# Patient Record
Sex: Male | Born: 1965 | Race: Black or African American | Hispanic: No
Health system: Southern US, Community
[De-identification: ages and names within clinical notes are randomized; demographics above are authoritative.]

---

## 2005-12-06 ENCOUNTER — Emergency Department: Payer: Self-pay | Admitting: General Practice

## 2006-09-16 ENCOUNTER — Emergency Department: Payer: Self-pay | Admitting: Emergency Medicine

## 2010-05-21 ENCOUNTER — Emergency Department: Payer: Self-pay | Admitting: Internal Medicine

## 2010-08-15 ENCOUNTER — Ambulatory Visit: Payer: Self-pay | Admitting: Internal Medicine

## 2010-08-20 ENCOUNTER — Inpatient Hospital Stay: Payer: Self-pay | Admitting: Internal Medicine

## 2010-08-22 LAB — CEA: CEA: 693.8 ng/mL — ABNORMAL HIGH (ref 0.0–4.7)

## 2010-08-22 LAB — AFP TUMOR MARKER: AFP-Tumor Marker: 2.3 ng/mL (ref 0.0–8.3)

## 2010-08-22 LAB — PSA

## 2010-08-26 ENCOUNTER — Ambulatory Visit: Payer: Self-pay | Admitting: Vascular Surgery

## 2010-08-28 ENCOUNTER — Ambulatory Visit: Payer: Self-pay | Admitting: Internal Medicine

## 2010-09-15 ENCOUNTER — Ambulatory Visit: Payer: Self-pay | Admitting: Internal Medicine

## 2010-10-04 LAB — CANCER ANTIGEN 19-9

## 2010-10-15 ENCOUNTER — Ambulatory Visit: Payer: Self-pay | Admitting: Internal Medicine

## 2010-11-15 ENCOUNTER — Ambulatory Visit: Payer: Self-pay | Admitting: Internal Medicine

## 2010-12-12 LAB — CANCER ANTIGEN 19-9: CA 19-9: 34700 U/mL — ABNORMAL HIGH (ref 0–35)

## 2010-12-15 ENCOUNTER — Ambulatory Visit: Payer: Self-pay | Admitting: Internal Medicine

## 2011-01-15 ENCOUNTER — Ambulatory Visit: Payer: Self-pay | Admitting: Internal Medicine

## 2011-01-16 LAB — CANCER ANTIGEN 19-9: CA 19-9: 9168 U/mL — ABNORMAL HIGH (ref 0–35)

## 2011-02-15 ENCOUNTER — Ambulatory Visit: Payer: Self-pay | Admitting: Internal Medicine

## 2011-02-19 ENCOUNTER — Inpatient Hospital Stay: Payer: Self-pay | Admitting: Internal Medicine

## 2011-02-27 ENCOUNTER — Ambulatory Visit: Payer: Self-pay | Admitting: Internal Medicine

## 2011-03-17 ENCOUNTER — Ambulatory Visit: Payer: Self-pay | Admitting: Internal Medicine

## 2011-04-08 LAB — CANCER ANTIGEN 19-9: CA 19-9: 671 U/mL — ABNORMAL HIGH (ref 0–35)

## 2011-04-17 ENCOUNTER — Ambulatory Visit: Payer: Self-pay | Admitting: Internal Medicine

## 2011-04-29 LAB — CANCER ANTIGEN 19-9: CA 19-9: 329 U/mL — ABNORMAL HIGH (ref 0–35)

## 2011-05-17 ENCOUNTER — Ambulatory Visit: Payer: Self-pay | Admitting: Internal Medicine

## 2011-06-17 ENCOUNTER — Ambulatory Visit: Payer: Self-pay | Admitting: Internal Medicine

## 2011-06-18 LAB — BASIC METABOLIC PANEL
BUN: 16 mg/dL (ref 7–18)
Calcium, Total: 9.5 mg/dL (ref 8.5–10.1)
Chloride: 102 mmol/L (ref 98–107)
Co2: 29 mmol/L (ref 21–32)
Osmolality: 280 (ref 275–301)
Potassium: 4.4 mmol/L (ref 3.5–5.1)
Sodium: 139 mmol/L (ref 136–145)

## 2011-06-18 LAB — CBC CANCER CENTER
Basophil #: 0 x10 3/mm (ref 0.0–0.1)
Basophil %: 0.3 %
HCT: 41 % (ref 40.0–52.0)
HGB: 14.3 g/dL (ref 13.0–18.0)
Lymphocyte #: 3.9 x10 3/mm — ABNORMAL HIGH (ref 1.0–3.6)
MCH: 32.7 pg (ref 26.0–34.0)
MCV: 94 fL (ref 80–100)
Monocyte #: 1.4 x10 3/mm — ABNORMAL HIGH (ref 0.0–0.7)
Monocyte %: 11.6 %
Neutrophil #: 6.3 x10 3/mm (ref 1.4–6.5)
Platelet: 152 x10 3/mm (ref 150–440)
RBC: 4.37 10*6/uL — ABNORMAL LOW (ref 4.40–5.90)
RDW: 15.5 % — ABNORMAL HIGH (ref 11.5–14.5)
WBC: 11.7 x10 3/mm — ABNORMAL HIGH (ref 3.8–10.6)

## 2011-06-25 LAB — HEPATIC FUNCTION PANEL A (ARMC)
Albumin: 4.2 g/dL (ref 3.4–5.0)
Alkaline Phosphatase: 129 U/L (ref 50–136)
Bilirubin,Total: 0.3 mg/dL (ref 0.2–1.0)
SGOT(AST): 32 U/L (ref 15–37)

## 2011-06-25 LAB — CBC CANCER CENTER
Basophil #: 0.1 x10 3/mm (ref 0.0–0.1)
Basophil %: 0.5 %
Eosinophil #: 0.1 x10 3/mm (ref 0.0–0.7)
HGB: 13.9 g/dL (ref 13.0–18.0)
MCH: 32.8 pg (ref 26.0–34.0)
MCHC: 34.6 g/dL (ref 32.0–36.0)
Monocyte #: 1.2 x10 3/mm — ABNORMAL HIGH (ref 0.0–0.7)
Monocyte %: 9.3 %
Neutrophil %: 56.5 %
Platelet: 185 x10 3/mm (ref 150–440)
RBC: 4.24 10*6/uL — ABNORMAL LOW (ref 4.40–5.90)
RDW: 15.2 % — ABNORMAL HIGH (ref 11.5–14.5)

## 2011-06-26 LAB — CANCER ANTIGEN 19-9: CA 19-9: 82 U/mL — ABNORMAL HIGH (ref 0–35)

## 2011-07-07 LAB — CBC CANCER CENTER
Basophil #: 0 x10 3/mm (ref 0.0–0.1)
HCT: 39.7 % — ABNORMAL LOW (ref 40.0–52.0)
HGB: 13.6 g/dL (ref 13.0–18.0)
Lymphocyte %: 28.4 %
Monocyte %: 19 %
Neutrophil #: 4.9 x10 3/mm (ref 1.4–6.5)
Neutrophil %: 50.3 %
RDW: 15.3 % — ABNORMAL HIGH (ref 11.5–14.5)
WBC: 9.7 x10 3/mm (ref 3.8–10.6)

## 2011-07-07 LAB — BASIC METABOLIC PANEL
BUN: 10 mg/dL (ref 7–18)
Calcium, Total: 8.8 mg/dL (ref 8.5–10.1)
Chloride: 102 mmol/L (ref 98–107)
Co2: 26 mmol/L (ref 21–32)
Creatinine: 0.97 mg/dL (ref 0.60–1.30)
EGFR (African American): >60
Sodium: 138 mmol/L (ref 136–145)

## 2011-07-11 ENCOUNTER — Ambulatory Visit: Payer: Self-pay | Admitting: Surgery

## 2011-07-11 LAB — BASIC METABOLIC PANEL
BUN: 13 mg/dL (ref 7–18)
Chloride: 101 mmol/L (ref 98–107)
Co2: 25 mmol/L (ref 21–32)
Creatinine: 0.95 mg/dL (ref 0.60–1.30)
Potassium: 4.2 mmol/L (ref 3.5–5.1)

## 2011-07-11 LAB — CBC WITH DIFFERENTIAL/PLATELET
Basophil #: 0.1 10*3/uL (ref 0.0–0.1)
Basophil %: 0.5 %
Eosinophil #: 0.2 10*3/uL (ref 0.0–0.7)
Eosinophil %: 1.3 %
HCT: 40.6 % (ref 40.0–52.0)
HGB: 13.6 g/dL (ref 13.0–18.0)
Lymphocyte #: 4 10*3/uL — ABNORMAL HIGH (ref 1.0–3.6)
Lymphocyte %: 33.8 %
MCHC: 33.5 g/dL (ref 32.0–36.0)
Monocyte #: 1.2 10*3/uL — ABNORMAL HIGH (ref 0.0–0.7)
Neutrophil %: 54.5 %
Platelet: 158 10*3/uL (ref 150–440)
RBC: 4.25 10*6/uL — ABNORMAL LOW (ref 4.40–5.90)

## 2011-07-17 ENCOUNTER — Ambulatory Visit: Payer: Self-pay | Admitting: Surgery

## 2011-07-18 ENCOUNTER — Ambulatory Visit: Payer: Self-pay | Admitting: Internal Medicine

## 2011-07-28 LAB — BASIC METABOLIC PANEL
Anion Gap: 11 (ref 7–16)
BUN: 16 mg/dL (ref 7–18)
Chloride: 100 mmol/L (ref 98–107)
Co2: 27 mmol/L (ref 21–32)
Creatinine: 1.11 mg/dL (ref 0.60–1.30)
EGFR (African American): >60
Potassium: 4.5 mmol/L (ref 3.5–5.1)

## 2011-07-28 LAB — CBC CANCER CENTER
Basophil #: 0 x10 3/mm (ref 0.0–0.1)
Eosinophil #: 0.2 x10 3/mm (ref 0.0–0.7)
HCT: 41.2 % (ref 40.0–52.0)
Lymphocyte %: 24 %
MCHC: 34.3 g/dL (ref 32.0–36.0)
MCV: 93 fL (ref 80–100)
Monocyte %: 14.9 %
Neutrophil #: 6.6 x10 3/mm — ABNORMAL HIGH (ref 1.4–6.5)
Neutrophil %: 59.3 %
RDW: 14.6 % — ABNORMAL HIGH (ref 11.5–14.5)
WBC: 11.1 x10 3/mm — ABNORMAL HIGH (ref 3.8–10.6)

## 2011-07-28 LAB — HEPATIC FUNCTION PANEL A (ARMC)
Albumin: 4.2 g/dL (ref 3.4–5.0)
Alkaline Phosphatase: 90 U/L (ref 50–136)
SGOT(AST): 46 U/L — ABNORMAL HIGH (ref 15–37)
SGPT (ALT): 78 U/L
Total Protein: 8.1 g/dL (ref 6.4–8.2)

## 2011-08-04 LAB — CBC CANCER CENTER
Basophil %: 0.4 %
Eosinophil #: 0.2 x10 3/mm (ref 0.0–0.7)
HCT: 40 % (ref 40.0–52.0)
Lymphocyte %: 34.7 %
MCH: 31.9 pg (ref 26.0–34.0)
MCV: 93 fL (ref 80–100)
Monocyte #: 0.9 x10 3/mm — ABNORMAL HIGH (ref 0.0–0.7)
Monocyte %: 7.9 %
Platelet: 195 x10 3/mm (ref 150–440)
RBC: 4.3 10*6/uL — ABNORMAL LOW (ref 4.40–5.90)
WBC: 12 x10 3/mm — ABNORMAL HIGH (ref 3.8–10.6)

## 2011-08-15 ENCOUNTER — Ambulatory Visit: Payer: Self-pay | Admitting: Internal Medicine

## 2011-08-18 LAB — BASIC METABOLIC PANEL
Anion Gap: 11 (ref 7–16)
Calcium, Total: 9.5 mg/dL (ref 8.5–10.1)
Co2: 25 mmol/L (ref 21–32)
EGFR (African American): >60
EGFR (Non-African Amer.): >60
Glucose: 110 mg/dL — ABNORMAL HIGH (ref 65–99)
Osmolality: 289 (ref 275–301)
Potassium: 3.9 mmol/L (ref 3.5–5.1)
Sodium: 144 mmol/L (ref 136–145)

## 2011-08-18 LAB — CBC CANCER CENTER
Basophil %: 0.2 %
Eosinophil #: 0.1 x10 3/mm (ref 0.0–0.7)
HCT: 39.9 % — ABNORMAL LOW (ref 40.0–52.0)
HGB: 13.7 g/dL (ref 13.0–18.0)
Lymphocyte #: 3.2 x10 3/mm (ref 1.0–3.6)
MCH: 31.9 pg (ref 26.0–34.0)
MCHC: 34.3 g/dL (ref 32.0–36.0)
Monocyte #: 1.4 x10 3/mm — ABNORMAL HIGH (ref 0.0–0.7)
Neutrophil %: 49.5 %
Platelet: 165 x10 3/mm (ref 150–440)

## 2011-09-01 LAB — CBC CANCER CENTER
Basophil #: 0 x10 3/mm (ref 0.0–0.1)
Eosinophil #: 0.1 x10 3/mm (ref 0.0–0.7)
HCT: 39 % — ABNORMAL LOW (ref 40.0–52.0)
HGB: 13.4 g/dL (ref 13.0–18.0)
Lymphocyte %: 32.2 %
MCHC: 34.2 g/dL (ref 32.0–36.0)
MCV: 93 fL (ref 80–100)
Monocyte %: 8.9 %
Neutrophil #: 6.4 x10 3/mm (ref 1.4–6.5)
Neutrophil %: 57.6 %
Platelet: 165 x10 3/mm (ref 150–440)
RDW: 14.9 % — ABNORMAL HIGH (ref 11.5–14.5)

## 2011-09-01 LAB — HEPATIC FUNCTION PANEL A (ARMC)
Bilirubin, Direct: 0.1 mg/dL (ref 0.00–0.20)
Bilirubin,Total: 0.3 mg/dL (ref 0.2–1.0)
SGPT (ALT): 45 U/L
Total Protein: 7.7 g/dL (ref 6.4–8.2)

## 2011-09-01 LAB — CREATININE, SERUM: Creatinine: 0.96 mg/dL (ref 0.60–1.30)

## 2011-09-15 ENCOUNTER — Ambulatory Visit: Payer: Self-pay | Admitting: Internal Medicine

## 2011-10-21 ENCOUNTER — Ambulatory Visit: Payer: Self-pay | Admitting: Internal Medicine

## 2011-10-21 LAB — CREATININE, SERUM
Creatinine: 0.92 mg/dL (ref 0.60–1.30)
EGFR (African American): >60
EGFR (Non-African Amer.): >60

## 2011-10-22 LAB — CANCER ANTIGEN 19-9: CA 19-9: 101 U/mL — ABNORMAL HIGH (ref 0–35)

## 2011-11-14 LAB — LIPASE, BLOOD: Lipase: 90 U/L (ref 73–393)

## 2011-11-14 LAB — URINALYSIS, COMPLETE
Blood: NEGATIVE
Leukocyte Esterase: NEGATIVE
Nitrite: NEGATIVE
Protein: 30
RBC,UR: <1 /HPF (ref 0–5)
WBC UR: 2 /HPF (ref 0–5)

## 2011-11-14 LAB — BASIC METABOLIC PANEL
BUN: 9 mg/dL (ref 7–18)
Calcium, Total: 9 mg/dL (ref 8.5–10.1)
Chloride: 105 mmol/L (ref 98–107)
Creatinine: 0.87 mg/dL (ref 0.60–1.30)
EGFR (African American): >60
Glucose: 124 mg/dL — ABNORMAL HIGH (ref 65–99)
Osmolality: 276 (ref 275–301)
Sodium: 138 mmol/L (ref 136–145)

## 2011-11-14 LAB — CBC CANCER CENTER
Basophil #: 0.1 x10 3/mm (ref 0.0–0.1)
Basophil %: 1.1 %
HGB: 14.1 g/dL (ref 13.0–18.0)
Lymphocyte #: 1.9 x10 3/mm (ref 1.0–3.6)
Lymphocyte %: 22.4 %
MCH: 30.7 pg (ref 26.0–34.0)
MCHC: 33.3 g/dL (ref 32.0–36.0)
Monocyte #: 0.7 x10 3/mm (ref 0.2–1.0)
Monocyte %: 7.8 %
Neutrophil #: 5.8 x10 3/mm (ref 1.4–6.5)
Platelet: 150 x10 3/mm (ref 150–440)
RBC: 4.6 10*6/uL (ref 4.40–5.90)
RDW: 14.9 % — ABNORMAL HIGH (ref 11.5–14.5)
WBC: 8.5 x10 3/mm (ref 3.8–10.6)

## 2011-11-14 LAB — HEPATIC FUNCTION PANEL A (ARMC)
Albumin: 3.7 g/dL (ref 3.4–5.0)
SGPT (ALT): 738 U/L — ABNORMAL HIGH
Total Protein: 7.7 g/dL (ref 6.4–8.2)

## 2011-11-15 ENCOUNTER — Ambulatory Visit: Payer: Self-pay | Admitting: Internal Medicine

## 2011-11-15 LAB — URINE CULTURE

## 2011-11-17 ENCOUNTER — Ambulatory Visit: Payer: Self-pay | Admitting: Internal Medicine

## 2011-11-17 LAB — HEPATIC FUNCTION PANEL A (ARMC)
Bilirubin, Direct: 5.1 mg/dL — ABNORMAL HIGH (ref 0.00–0.20)
Bilirubin,Total: 6.3 mg/dL — ABNORMAL HIGH (ref 0.2–1.0)
SGPT (ALT): 482 U/L — ABNORMAL HIGH
Total Protein: 7.8 g/dL (ref 6.4–8.2)

## 2011-11-24 LAB — CBC CANCER CENTER
Basophil %: 0.6 %
Eosinophil #: 0.1 x10 3/mm (ref 0.0–0.7)
Eosinophil %: 0.9 %
HGB: 13 g/dL (ref 13.0–18.0)
Lymphocyte #: 1.8 x10 3/mm (ref 1.0–3.6)
MCH: 30.7 pg (ref 26.0–34.0)
MCHC: 33.1 g/dL (ref 32.0–36.0)
MCV: 93 fL (ref 80–100)
Monocyte #: 1.1 x10 3/mm — ABNORMAL HIGH (ref 0.2–1.0)
Neutrophil #: 6.9 x10 3/mm — ABNORMAL HIGH (ref 1.4–6.5)
RBC: 4.23 10*6/uL — ABNORMAL LOW (ref 4.40–5.90)
RDW: 15.9 % — ABNORMAL HIGH (ref 11.5–14.5)
WBC: 10 x10 3/mm (ref 3.8–10.6)

## 2011-11-24 LAB — HEPATIC FUNCTION PANEL A (ARMC)
Alkaline Phosphatase: 164 U/L — ABNORMAL HIGH (ref 50–136)
Bilirubin, Direct: 10.5 mg/dL — ABNORMAL HIGH (ref 0.00–0.20)
Bilirubin,Total: 12.5 mg/dL — ABNORMAL HIGH (ref 0.2–1.0)
SGOT(AST): 132 U/L — ABNORMAL HIGH (ref 15–37)
SGPT (ALT): 252 U/L — ABNORMAL HIGH

## 2011-11-24 LAB — CREATININE, SERUM
Creatinine: 0.94 mg/dL (ref 0.60–1.30)
EGFR (African American): >60
EGFR (Non-African Amer.): >60

## 2011-11-25 LAB — CANCER ANTIGEN 19-9: CA 19-9: 2170 U/mL — ABNORMAL HIGH (ref 0–35)

## 2011-11-26 ENCOUNTER — Ambulatory Visit: Payer: Self-pay | Admitting: Unknown Physician Specialty

## 2011-11-28 ENCOUNTER — Other Ambulatory Visit: Payer: Self-pay | Admitting: Unknown Physician Specialty

## 2011-11-28 LAB — APTT: Activated PTT: 28.4 secs (ref 23.6–35.9)

## 2011-12-02 ENCOUNTER — Ambulatory Visit: Payer: Self-pay | Admitting: Unknown Physician Specialty

## 2011-12-04 ENCOUNTER — Inpatient Hospital Stay: Payer: Self-pay | Admitting: Internal Medicine

## 2011-12-04 LAB — COMPREHENSIVE METABOLIC PANEL
Alkaline Phosphatase: 253 U/L — ABNORMAL HIGH (ref 50–136)
Calcium, Total: 9.7 mg/dL (ref 8.5–10.1)
Chloride: 95 mmol/L — ABNORMAL LOW (ref 98–107)
Co2: 24 mmol/L (ref 21–32)
EGFR (African American): >60
EGFR (Non-African Amer.): >60
Osmolality: 274 (ref 275–301)
Potassium: 4 mmol/L (ref 3.5–5.1)
SGPT (ALT): 116 U/L — ABNORMAL HIGH
Sodium: 131 mmol/L — ABNORMAL LOW (ref 136–145)

## 2011-12-04 LAB — CBC CANCER CENTER
Basophil #: 0.1 x10 3/mm (ref 0.0–0.1)
Basophil %: 0.9 %
Eosinophil #: 0.1 x10 3/mm (ref 0.0–0.7)
HGB: 13.3 g/dL (ref 13.0–18.0)
MCH: 30.6 pg (ref 26.0–34.0)
MCV: 93 fL (ref 80–100)
Monocyte #: 1.5 x10 3/mm — ABNORMAL HIGH (ref 0.2–1.0)
Monocyte %: 9.8 %
Neutrophil #: 11.4 x10 3/mm — ABNORMAL HIGH (ref 1.4–6.5)
Platelet: 220 x10 3/mm (ref 150–440)
RBC: 4.36 10*6/uL — ABNORMAL LOW (ref 4.40–5.90)
RDW: 16.6 % — ABNORMAL HIGH (ref 11.5–14.5)

## 2011-12-05 LAB — COMPREHENSIVE METABOLIC PANEL
Albumin: 2.6 g/dL — ABNORMAL LOW (ref 3.4–5.0)
Alkaline Phosphatase: 260 U/L — ABNORMAL HIGH (ref 50–136)
Anion Gap: 9 (ref 7–16)
Chloride: 96 mmol/L — ABNORMAL LOW (ref 98–107)
Co2: 27 mmol/L (ref 21–32)
EGFR (Non-African Amer.): >60
Glucose: 318 mg/dL — ABNORMAL HIGH (ref 65–99)
SGPT (ALT): 97 U/L — ABNORMAL HIGH

## 2011-12-05 LAB — PATHOLOGY REPORT

## 2011-12-05 LAB — BILIRUBIN, DIRECT: Bilirubin, Direct: 14.1 mg/dL — ABNORMAL HIGH (ref 0.00–0.20)

## 2011-12-06 LAB — HEPATIC FUNCTION PANEL A (ARMC)
Albumin: 2.5 g/dL — ABNORMAL LOW (ref 3.4–5.0)
Bilirubin, Direct: 15 mg/dL — ABNORMAL HIGH (ref 0.00–0.20)
Bilirubin,Total: 19 mg/dL — ABNORMAL HIGH (ref 0.2–1.0)
SGPT (ALT): 92 U/L — ABNORMAL HIGH
Total Protein: 7.1 g/dL (ref 6.4–8.2)

## 2011-12-07 LAB — COMPREHENSIVE METABOLIC PANEL
Alkaline Phosphatase: 258 U/L — ABNORMAL HIGH (ref 50–136)
Bilirubin,Total: 18.9 mg/dL — ABNORMAL HIGH (ref 0.2–1.0)
Glucose: 199 mg/dL — ABNORMAL HIGH (ref 65–99)
Osmolality: 266 (ref 275–301)
Potassium: 3.9 mmol/L (ref 3.5–5.1)
SGOT(AST): 92 U/L — ABNORMAL HIGH (ref 15–37)
Total Protein: 6.9 g/dL (ref 6.4–8.2)

## 2011-12-08 LAB — SODIUM: Sodium: 132 mmol/L — ABNORMAL LOW (ref 136–145)

## 2011-12-08 LAB — HEPATIC FUNCTION PANEL A (ARMC)
Albumin: 2.4 g/dL — ABNORMAL LOW (ref 3.4–5.0)
SGOT(AST): 91 U/L — ABNORMAL HIGH (ref 15–37)
SGPT (ALT): 84 U/L — ABNORMAL HIGH

## 2011-12-08 LAB — CREATININE, SERUM: Creatinine: 0.69 mg/dL (ref 0.60–1.30)

## 2011-12-09 LAB — BASIC METABOLIC PANEL
Anion Gap: 8 (ref 7–16)
BUN: 7 mg/dL (ref 7–18)
Calcium, Total: 8.8 mg/dL (ref 8.5–10.1)
EGFR (African American): >60
EGFR (Non-African Amer.): >60
Glucose: 170 mg/dL — ABNORMAL HIGH (ref 65–99)
Potassium: 4.3 mmol/L (ref 3.5–5.1)
Sodium: 132 mmol/L — ABNORMAL LOW (ref 136–145)

## 2011-12-09 LAB — CBC WITH DIFFERENTIAL/PLATELET
Basophil #: 0.1 10*3/uL (ref 0.0–0.1)
Basophil %: 0.9 %
Eosinophil #: 0.2 10*3/uL (ref 0.0–0.7)
Eosinophil %: 1 %
HGB: 12 g/dL — ABNORMAL LOW (ref 13.0–18.0)
Lymphocyte #: 2.4 10*3/uL (ref 1.0–3.6)
Lymphocyte %: 15.1 %
MCH: 31.1 pg (ref 26.0–34.0)
MCV: 93 fL (ref 80–100)
Monocyte #: 1.6 x10 3/mm — ABNORMAL HIGH (ref 0.2–1.0)
Monocyte %: 9.9 %
Neutrophil #: 11.5 10*3/uL — ABNORMAL HIGH (ref 1.4–6.5)
Neutrophil %: 73.1 %
RDW: 16.8 % — ABNORMAL HIGH (ref 11.5–14.5)
WBC: 15.8 10*3/uL — ABNORMAL HIGH (ref 3.8–10.6)

## 2011-12-09 LAB — HEPATIC FUNCTION PANEL A (ARMC)
Alkaline Phosphatase: 256 U/L — ABNORMAL HIGH (ref 50–136)
Bilirubin, Direct: 14.9 mg/dL — ABNORMAL HIGH (ref 0.00–0.20)
SGPT (ALT): 83 U/L — ABNORMAL HIGH

## 2011-12-10 LAB — COMPREHENSIVE METABOLIC PANEL
Alkaline Phosphatase: 264 U/L — ABNORMAL HIGH (ref 50–136)
BUN: 6 mg/dL — ABNORMAL LOW (ref 7–18)
Creatinine: 0.71 mg/dL (ref 0.60–1.30)
EGFR (African American): >60
Glucose: 126 mg/dL — ABNORMAL HIGH (ref 65–99)
Osmolality: 264 (ref 275–301)
Total Protein: 6.8 g/dL (ref 6.4–8.2)

## 2011-12-10 LAB — CANCER ANTIGEN 19-9: CA 19-9: 7396 U/mL — ABNORMAL HIGH (ref 0–35)

## 2011-12-11 LAB — PROTIME-INR: INR: 1.1

## 2011-12-11 LAB — COMPREHENSIVE METABOLIC PANEL
BUN: 8 mg/dL (ref 7–18)
Bilirubin,Total: 21.2 mg/dL — ABNORMAL HIGH (ref 0.2–1.0)
Calcium, Total: 8.8 mg/dL (ref 8.5–10.1)
Creatinine: 0.72 mg/dL (ref 0.60–1.30)
EGFR (African American): >60
Potassium: 4.1 mmol/L (ref 3.5–5.1)
SGOT(AST): 100 U/L — ABNORMAL HIGH (ref 15–37)
SGPT (ALT): 81 U/L — ABNORMAL HIGH

## 2011-12-11 LAB — APTT: Activated PTT: 30.8 secs (ref 23.6–35.9)

## 2011-12-12 LAB — COMPREHENSIVE METABOLIC PANEL
Alkaline Phosphatase: 275 U/L — ABNORMAL HIGH (ref 50–136)
Anion Gap: 6 — ABNORMAL LOW (ref 7–16)
Bilirubin,Total: 22.2 mg/dL — ABNORMAL HIGH (ref 0.2–1.0)
Calcium, Total: 8.7 mg/dL (ref 8.5–10.1)
EGFR (Non-African Amer.): >60
Glucose: 190 mg/dL — ABNORMAL HIGH (ref 65–99)
Osmolality: 275 (ref 275–301)
Potassium: 4.4 mmol/L (ref 3.5–5.1)
SGPT (ALT): 95 U/L — ABNORMAL HIGH
Total Protein: 6.8 g/dL (ref 6.4–8.2)

## 2011-12-12 LAB — CBC WITH DIFFERENTIAL/PLATELET
Basophil #: 0.1 10*3/uL (ref 0.0–0.1)
Basophil %: 0.4 %
Eosinophil %: 0.2 %
HGB: 11.6 g/dL — ABNORMAL LOW (ref 13.0–18.0)
Lymphocyte #: 4.8 10*3/uL — ABNORMAL HIGH (ref 1.0–3.6)
Lymphocyte %: 23.1 %
MCH: 31.3 pg (ref 26.0–34.0)
MCV: 93 fL (ref 80–100)
Monocyte #: 1.8 x10 3/mm — ABNORMAL HIGH (ref 0.2–1.0)
Monocyte %: 8.6 %
Neutrophil %: 67.7 %
Platelet: 198 10*3/uL (ref 150–440)
WBC: 20.7 10*3/uL — ABNORMAL HIGH (ref 3.8–10.6)

## 2011-12-13 LAB — CBC WITH DIFFERENTIAL/PLATELET
Eosinophil #: 0.2 10*3/uL (ref 0.0–0.7)
Eosinophil %: 1.2 %
HCT: 34.2 % — ABNORMAL LOW (ref 40.0–52.0)
Lymphocyte %: 8.8 %
MCH: 31.5 pg (ref 26.0–34.0)
MCHC: 33.2 g/dL (ref 32.0–36.0)
Monocyte #: 1.9 x10 3/mm — ABNORMAL HIGH (ref 0.2–1.0)
Monocyte %: 10.4 %
Neutrophil #: 14.1 10*3/uL — ABNORMAL HIGH (ref 1.4–6.5)
Neutrophil %: 79.1 %
Platelet: 187 10*3/uL (ref 150–440)

## 2011-12-13 LAB — COMPREHENSIVE METABOLIC PANEL
Alkaline Phosphatase: 254 U/L — ABNORMAL HIGH (ref 50–136)
Anion Gap: 9 (ref 7–16)
BUN: 9 mg/dL (ref 7–18)
Calcium, Total: 8.9 mg/dL (ref 8.5–10.1)
Chloride: 100 mmol/L (ref 98–107)
Co2: 26 mmol/L (ref 21–32)
EGFR (African American): >60
EGFR (Non-African Amer.): >60
SGOT(AST): 88 U/L — ABNORMAL HIGH (ref 15–37)
Sodium: 135 mmol/L — ABNORMAL LOW (ref 136–145)

## 2011-12-14 LAB — HEPATIC FUNCTION PANEL A (ARMC)
Albumin: 2 g/dL — ABNORMAL LOW (ref 3.4–5.0)
Alkaline Phosphatase: 229 U/L — ABNORMAL HIGH (ref 50–136)
Bilirubin, Direct: 14.5 mg/dL — ABNORMAL HIGH (ref 0.00–0.20)
Bilirubin,Total: 18.1 mg/dL — ABNORMAL HIGH (ref 0.2–1.0)
SGOT(AST): 80 U/L — ABNORMAL HIGH (ref 15–37)

## 2011-12-14 LAB — CBC WITH DIFFERENTIAL/PLATELET
Basophil %: 0.3 %
HGB: 10.7 g/dL — ABNORMAL LOW (ref 13.0–18.0)
Lymphocyte #: 1.8 10*3/uL (ref 1.0–3.6)
Lymphocyte %: 9.8 %
MCH: 31 pg (ref 26.0–34.0)
Monocyte #: 1.9 x10 3/mm — ABNORMAL HIGH (ref 0.2–1.0)
Monocyte %: 10.4 %
Neutrophil #: 14.4 10*3/uL — ABNORMAL HIGH (ref 1.4–6.5)
Neutrophil %: 78.1 %
Platelet: 188 10*3/uL (ref 150–440)
WBC: 18.2 10*3/uL — ABNORMAL HIGH (ref 3.8–10.6)

## 2011-12-15 ENCOUNTER — Ambulatory Visit: Payer: Self-pay | Admitting: Internal Medicine

## 2011-12-15 LAB — CBC WITH DIFFERENTIAL/PLATELET
Basophil #: 0.1 10*3/uL (ref 0.0–0.1)
Eosinophil #: 0.2 10*3/uL (ref 0.0–0.7)
Eosinophil %: 1.5 %
MCH: 32.1 pg (ref 26.0–34.0)
MCHC: 34 g/dL (ref 32.0–36.0)
MCV: 94 fL (ref 80–100)
Monocyte #: 1.5 x10 3/mm — ABNORMAL HIGH (ref 0.2–1.0)
Neutrophil #: 10.9 10*3/uL — ABNORMAL HIGH (ref 1.4–6.5)
Neutrophil %: 72.8 %
Platelet: 179 10*3/uL (ref 150–440)
WBC: 15 10*3/uL — ABNORMAL HIGH (ref 3.8–10.6)

## 2011-12-15 LAB — HEPATIC FUNCTION PANEL A (ARMC)
SGOT(AST): 77 U/L — ABNORMAL HIGH (ref 15–37)
SGPT (ALT): 70 U/L

## 2011-12-15 LAB — BASIC METABOLIC PANEL
Anion Gap: 6 — ABNORMAL LOW (ref 7–16)
BUN: 8 mg/dL (ref 7–18)
Calcium, Total: 8.6 mg/dL (ref 8.5–10.1)
Co2: 29 mmol/L (ref 21–32)
EGFR (African American): >60
EGFR (Non-African Amer.): >60
Glucose: 128 mg/dL — ABNORMAL HIGH (ref 65–99)
Osmolality: 270 (ref 275–301)
Sodium: 135 mmol/L — ABNORMAL LOW (ref 136–145)

## 2011-12-15 LAB — AFP TUMOR MARKER: AFP-Tumor Marker: 1.8 ng/mL (ref 0.0–8.3)

## 2011-12-15 LAB — CEA: CEA: 144.2 ng/mL — ABNORMAL HIGH (ref 0.0–4.7)

## 2011-12-16 LAB — HEPATIC FUNCTION PANEL A (ARMC)
Albumin: 1.9 g/dL — ABNORMAL LOW (ref 3.4–5.0)
Alkaline Phosphatase: 212 U/L — ABNORMAL HIGH (ref 50–136)
Bilirubin,Total: 16.7 mg/dL — ABNORMAL HIGH (ref 0.2–1.0)
SGPT (ALT): 70 U/L

## 2011-12-17 LAB — URINALYSIS, COMPLETE
Blood: NEGATIVE
Glucose,UR: NEGATIVE mg/dL (ref 0–75)
Ketone: NEGATIVE
Nitrite: NEGATIVE
Ph: 6 (ref 4.5–8.0)
Protein: NEGATIVE
RBC,UR: 1 /HPF (ref 0–5)
Squamous Epithelial: <1
WBC UR: 3 /HPF (ref 0–5)

## 2011-12-17 LAB — HEPATIC FUNCTION PANEL A (ARMC)
Bilirubin, Direct: 13.9 mg/dL — ABNORMAL HIGH (ref 0.00–0.20)
Bilirubin,Total: 16.9 mg/dL — ABNORMAL HIGH (ref 0.2–1.0)
SGOT(AST): 126 U/L — ABNORMAL HIGH (ref 15–37)
Total Protein: 6.7 g/dL (ref 6.4–8.2)

## 2011-12-19 LAB — COMPREHENSIVE METABOLIC PANEL
Albumin: 1.6 g/dL — ABNORMAL LOW (ref 3.4–5.0)
Alkaline Phosphatase: 160 U/L — ABNORMAL HIGH (ref 50–136)
Anion Gap: 8 (ref 7–16)
BUN: 14 mg/dL (ref 7–18)
Calcium, Total: 8.6 mg/dL (ref 8.5–10.1)
Co2: 25 mmol/L (ref 21–32)
Creatinine: 0.69 mg/dL (ref 0.60–1.30)
EGFR (Non-African Amer.): >60
Osmolality: 269 (ref 275–301)
SGOT(AST): 118 U/L — ABNORMAL HIGH (ref 15–37)
SGPT (ALT): 99 U/L — ABNORMAL HIGH
Sodium: 133 mmol/L — ABNORMAL LOW (ref 136–145)
Total Protein: 6.2 g/dL — ABNORMAL LOW (ref 6.4–8.2)

## 2011-12-19 LAB — CBC WITH DIFFERENTIAL/PLATELET
Eosinophil %: 0.3 %
HCT: 26.2 % — ABNORMAL LOW (ref 40.0–52.0)
HGB: 8.9 g/dL — ABNORMAL LOW (ref 13.0–18.0)
Lymphocyte #: 0.6 10*3/uL — ABNORMAL LOW (ref 1.0–3.6)
MCH: 32.5 pg (ref 26.0–34.0)
Monocyte %: 1.4 %
Neutrophil #: 17.2 10*3/uL — ABNORMAL HIGH (ref 1.4–6.5)
Neutrophil %: 94.7 %
Platelet: 165 10*3/uL (ref 150–440)
RBC: 2.74 10*6/uL — ABNORMAL LOW (ref 4.40–5.90)
WBC: 18.2 10*3/uL — ABNORMAL HIGH (ref 3.8–10.6)

## 2011-12-22 LAB — CULTURE, BLOOD (SINGLE)

## 2011-12-23 ENCOUNTER — Ambulatory Visit: Payer: Self-pay | Admitting: Gastroenterology

## 2011-12-23 LAB — COMPREHENSIVE METABOLIC PANEL
Alkaline Phosphatase: 179 U/L — ABNORMAL HIGH (ref 50–136)
BUN: 12 mg/dL (ref 7–18)
Bilirubin,Total: 15.2 mg/dL — ABNORMAL HIGH (ref 0.2–1.0)
Chloride: 101 mmol/L (ref 98–107)
Creatinine: 0.9 mg/dL (ref 0.60–1.30)
EGFR (African American): >60
Glucose: 133 mg/dL — ABNORMAL HIGH (ref 65–99)
Osmolality: 270 (ref 275–301)
Potassium: 4.3 mmol/L (ref 3.5–5.1)
SGPT (ALT): 143 U/L — ABNORMAL HIGH
Sodium: 134 mmol/L — ABNORMAL LOW (ref 136–145)
Total Protein: 6.9 g/dL (ref 6.4–8.2)

## 2011-12-23 LAB — CBC WITH DIFFERENTIAL/PLATELET
Basophil %: 0.5 %
Eosinophil #: 0 10*3/uL (ref 0.0–0.7)
HCT: 27 % — ABNORMAL LOW (ref 40.0–52.0)
HGB: 9.2 g/dL — ABNORMAL LOW (ref 13.0–18.0)
Lymphocyte #: 1.7 10*3/uL (ref 1.0–3.6)
MCH: 32.4 pg (ref 26.0–34.0)
MCHC: 34.2 g/dL (ref 32.0–36.0)
MCV: 95 fL (ref 80–100)
Monocyte #: 1.7 x10 3/mm — ABNORMAL HIGH (ref 0.2–1.0)
Neutrophil #: 18.2 10*3/uL — ABNORMAL HIGH (ref 1.4–6.5)
RBC: 2.84 10*6/uL — ABNORMAL LOW (ref 4.40–5.90)
RDW: 16.1 % — ABNORMAL HIGH (ref 11.5–14.5)

## 2011-12-23 LAB — CULTURE, BLOOD (SINGLE)

## 2011-12-24 ENCOUNTER — Ambulatory Visit: Payer: Self-pay | Admitting: Internal Medicine

## 2011-12-24 LAB — CBC CANCER CENTER
Basophil #: 0 x10 3/mm (ref 0.0–0.1)
Basophil %: 0.2 %
Eosinophil #: 0.1 x10 3/mm (ref 0.0–0.7)
Eosinophil %: 0.4 %
HCT: 27.3 % — ABNORMAL LOW (ref 40.0–52.0)
HGB: 8.9 g/dL — ABNORMAL LOW (ref 13.0–18.0)
Lymphocyte #: 2.3 x10 3/mm (ref 1.0–3.6)
Lymphocyte %: 9.6 %
MCH: 31.4 pg (ref 26.0–34.0)
MCHC: 32.6 g/dL (ref 32.0–36.0)
MCV: 96 fL (ref 80–100)
Monocyte #: 1.9 x10 3/mm — ABNORMAL HIGH (ref 0.2–1.0)
Monocyte %: 8.1 %
Neutrophil #: 19.3 x10 3/mm — ABNORMAL HIGH (ref 1.4–6.5)
Neutrophil %: 81.7 %
Platelet: 142 x10 3/mm — ABNORMAL LOW (ref 150–440)
RBC: 2.84 10*6/uL — ABNORMAL LOW (ref 4.40–5.90)
RDW: 15.9 % — ABNORMAL HIGH (ref 11.5–14.5)
WBC: 23.6 x10 3/mm — ABNORMAL HIGH (ref 3.8–10.6)

## 2011-12-24 LAB — BASIC METABOLIC PANEL
Anion Gap: 11 (ref 7–16)
Calcium, Total: 8.5 mg/dL (ref 8.5–10.1)
Creatinine: 0.91 mg/dL (ref 0.60–1.30)
EGFR (African American): >60
EGFR (Non-African Amer.): >60
Glucose: 138 mg/dL — ABNORMAL HIGH (ref 65–99)
Potassium: 4.4 mmol/L (ref 3.5–5.1)
Sodium: 130 mmol/L — ABNORMAL LOW (ref 136–145)

## 2011-12-31 LAB — CBC CANCER CENTER
Basophil %: 0.3 %
Eosinophil %: 0 %
HCT: 24.8 % — ABNORMAL LOW (ref 40.0–52.0)
HGB: 8.2 g/dL — ABNORMAL LOW (ref 13.0–18.0)
Lymphocyte %: 10.3 %
MCH: 31.7 pg (ref 26.0–34.0)
MCHC: 33.1 g/dL (ref 32.0–36.0)
MCV: 96 fL (ref 80–100)
Monocyte #: 2 x10 3/mm — ABNORMAL HIGH (ref 0.2–1.0)
Monocyte %: 10.8 %
Neutrophil #: 14.5 x10 3/mm — ABNORMAL HIGH (ref 1.4–6.5)
Neutrophil %: 78.6 %
RBC: 2.59 10*6/uL — ABNORMAL LOW (ref 4.40–5.90)
WBC: 18.4 x10 3/mm — ABNORMAL HIGH (ref 3.8–10.6)

## 2011-12-31 LAB — BASIC METABOLIC PANEL
Anion Gap: 10 (ref 7–16)
Chloride: 96 mmol/L — ABNORMAL LOW (ref 98–107)
Co2: 24 mmol/L (ref 21–32)
Creatinine: 0.89 mg/dL (ref 0.60–1.30)
EGFR (African American): >60
EGFR (Non-African Amer.): >60
Osmolality: 262 (ref 275–301)

## 2011-12-31 LAB — HEPATIC FUNCTION PANEL A (ARMC)
Albumin: 1.3 g/dL — ABNORMAL LOW (ref 3.4–5.0)
Alkaline Phosphatase: 238 U/L — ABNORMAL HIGH (ref 50–136)
Bilirubin, Direct: 14.8 mg/dL — ABNORMAL HIGH (ref 0.00–0.20)
SGOT(AST): 138 U/L — ABNORMAL HIGH (ref 15–37)
SGPT (ALT): 184 U/L — ABNORMAL HIGH

## 2012-01-14 LAB — CBC CANCER CENTER
Bands: 4 %
HCT: 22.7 % — ABNORMAL LOW (ref 40.0–52.0)
HGB: 7.4 g/dL — ABNORMAL LOW (ref 13.0–18.0)
Lymphocytes: 9 %
MCH: 32.5 pg (ref 26.0–34.0)
MCHC: 32.8 g/dL (ref 32.0–36.0)
NRBC/100 WBC: 1 /100
Platelet: 417 x10 3/mm (ref 150–440)
RBC: 2.29 10*6/uL — ABNORMAL LOW (ref 4.40–5.90)
RDW: 16.7 % — ABNORMAL HIGH (ref 11.5–14.5)
Segmented Neutrophils: 81 %
WBC: 28.8 x10 3/mm — ABNORMAL HIGH (ref 3.8–10.6)

## 2012-01-14 LAB — HEPATIC FUNCTION PANEL A (ARMC)
Albumin: 1.3 g/dL — ABNORMAL LOW (ref 3.4–5.0)
Alkaline Phosphatase: 226 U/L — ABNORMAL HIGH (ref 50–136)
Bilirubin, Direct: 11 mg/dL — ABNORMAL HIGH (ref 0.00–0.20)
SGOT(AST): 144 U/L — ABNORMAL HIGH (ref 15–37)
Total Protein: 6.5 g/dL (ref 6.4–8.2)

## 2012-01-14 LAB — BASIC METABOLIC PANEL
Anion Gap: 12 (ref 7–16)
BUN: 7 mg/dL (ref 7–18)
Calcium, Total: 8.1 mg/dL — ABNORMAL LOW (ref 8.5–10.1)
Co2: 21 mmol/L (ref 21–32)
EGFR (Non-African Amer.): >60
Glucose: 174 mg/dL — ABNORMAL HIGH (ref 65–99)
Osmolality: 263 (ref 275–301)
Potassium: 4.2 mmol/L (ref 3.5–5.1)

## 2012-01-15 ENCOUNTER — Ambulatory Visit: Payer: Self-pay | Admitting: Internal Medicine

## 2012-01-21 ENCOUNTER — Ambulatory Visit: Payer: Self-pay | Admitting: Internal Medicine

## 2012-01-22 ENCOUNTER — Ambulatory Visit: Payer: Self-pay | Admitting: Internal Medicine

## 2012-01-23 LAB — CBC CANCER CENTER
Basophil %: 0.2 %
Eosinophil #: 0 x10 3/mm (ref 0.0–0.7)
HCT: 20.7 % — ABNORMAL LOW (ref 40.0–52.0)
HGB: 6.7 g/dL — ABNORMAL LOW (ref 13.0–18.0)
Lymphocyte %: 10 %
MCH: 32.1 pg (ref 26.0–34.0)
MCHC: 32.6 g/dL (ref 32.0–36.0)
MCV: 98 fL (ref 80–100)
Monocyte #: 2.4 x10 3/mm — ABNORMAL HIGH (ref 0.2–1.0)
Neutrophil #: 22.5 x10 3/mm — ABNORMAL HIGH (ref 1.4–6.5)
Neutrophil %: 81.2 %
WBC: 27.8 x10 3/mm — ABNORMAL HIGH (ref 3.8–10.6)

## 2012-01-23 LAB — COMPREHENSIVE METABOLIC PANEL
Albumin: 1.4 g/dL — ABNORMAL LOW (ref 3.4–5.0)
Alkaline Phosphatase: 262 U/L — ABNORMAL HIGH (ref 50–136)
Anion Gap: 11 (ref 7–16)
BUN: 6 mg/dL — ABNORMAL LOW (ref 7–18)
Calcium, Total: 7.9 mg/dL — ABNORMAL LOW (ref 8.5–10.1)
Chloride: 98 mmol/L (ref 98–107)
Co2: 23 mmol/L (ref 21–32)
Creatinine: 0.62 mg/dL (ref 0.60–1.30)
EGFR (Non-African Amer.): >60
Osmolality: 267 (ref 275–301)
Potassium: 3 mmol/L — ABNORMAL LOW (ref 3.5–5.1)
SGPT (ALT): 82 U/L — ABNORMAL HIGH (ref 12–78)

## 2012-02-15 ENCOUNTER — Ambulatory Visit: Payer: Self-pay | Admitting: Internal Medicine

## 2012-03-16 ENCOUNTER — Ambulatory Visit: Payer: Self-pay | Admitting: Internal Medicine

## 2012-04-16 DEATH — deceased

## 2012-11-14 IMAGING — XA IR CHOLANGIOGRAM VIA EXIST CATHETER
5 series · 6 of 6 positions shown · non-contrast
Comparison: none

REASON FOR EXAM: jaundice
COMMENTS:

PROCEDURE:     VAS - TUBE CHECK - BILIARY  - January 21, 2012  [DATE]
RESULT:     History: Jaundice.

[Series 1: single · 1 of 1 slices shown (1 of 5)]
[im 1/1]
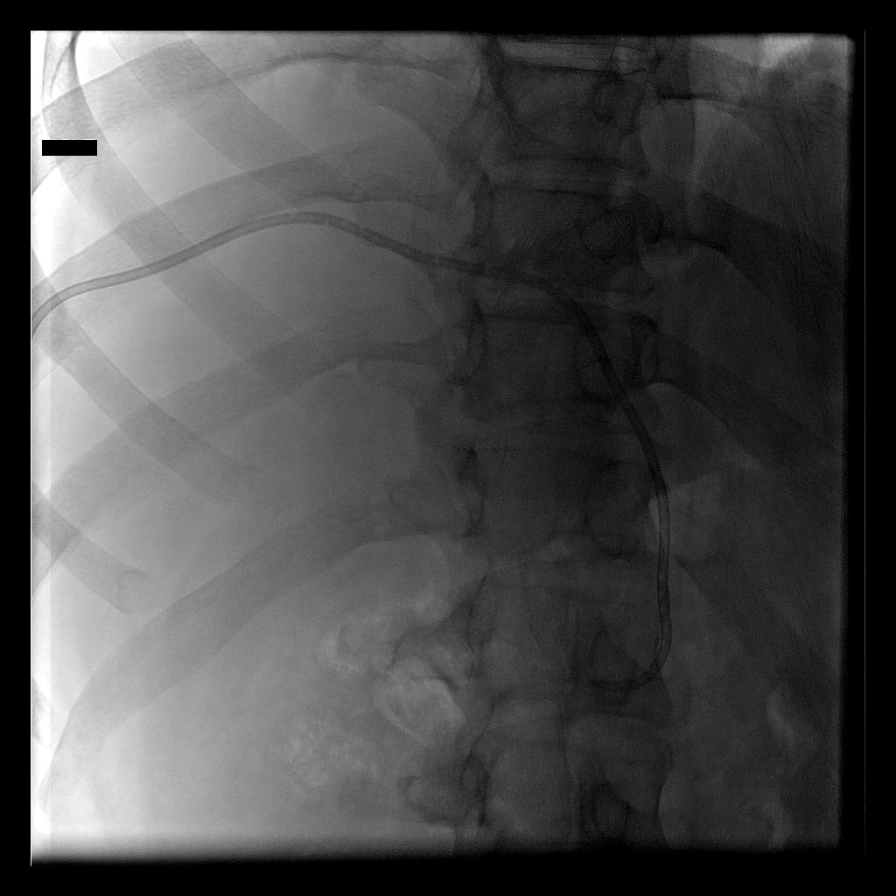

[Series 2: single · 1 of 1 slices shown (2 of 5)]
[im 1/1]
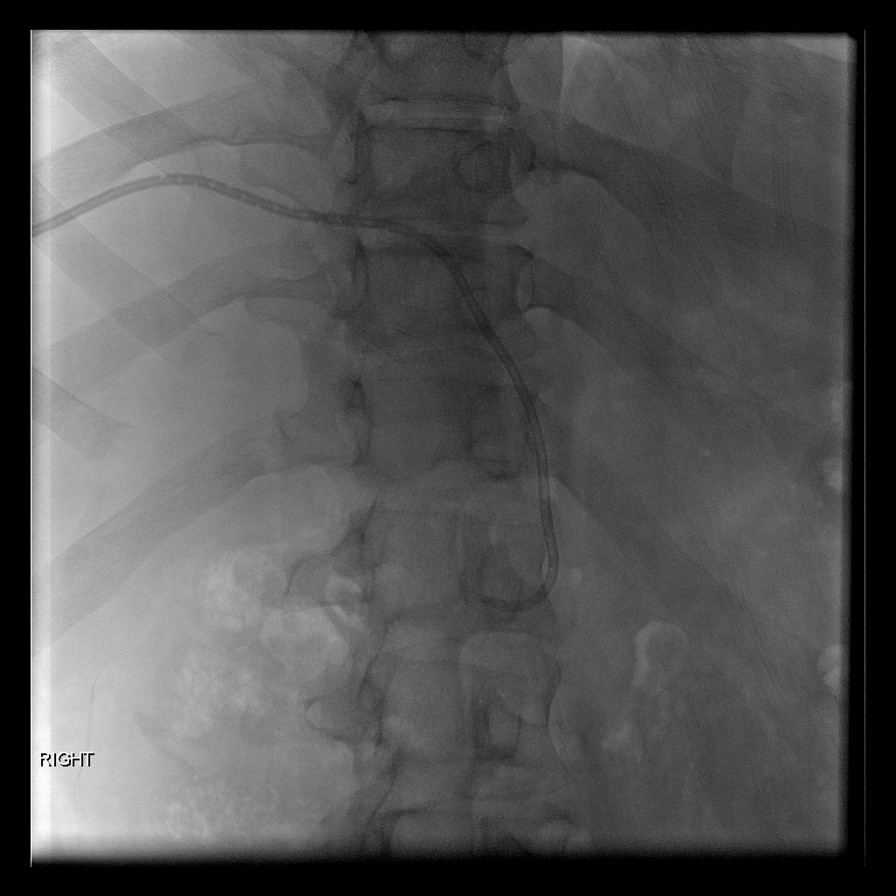

[Series 3: single · 2 of 2 slices shown (3 of 5)]
[im 1/2]
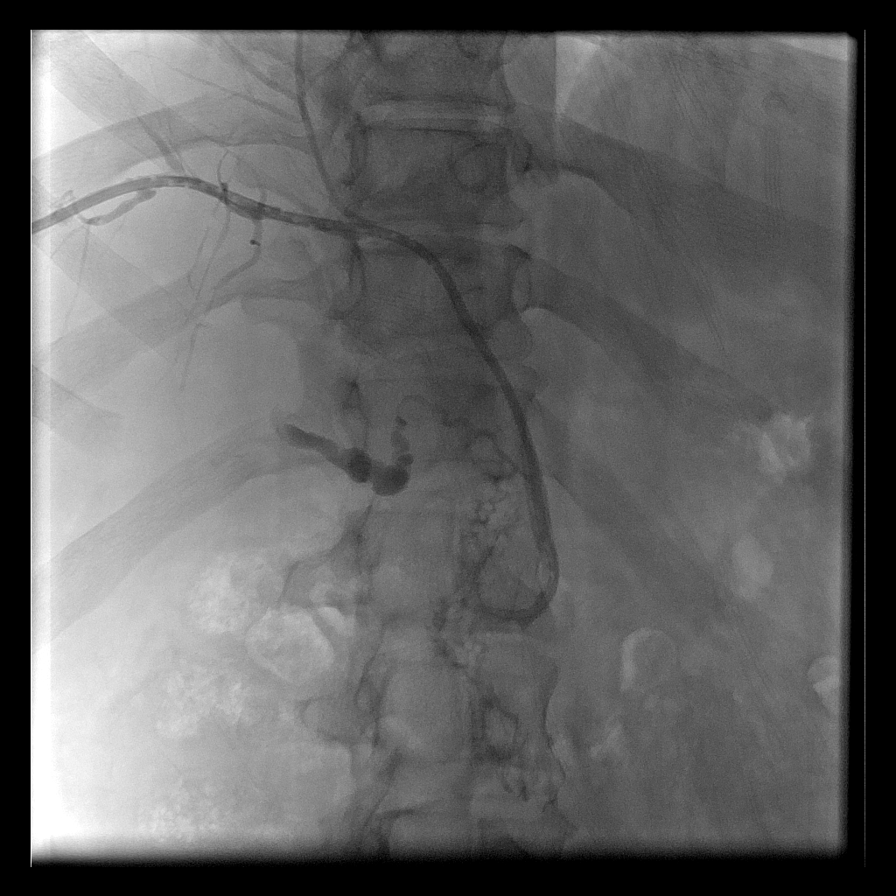
[im 2/2]
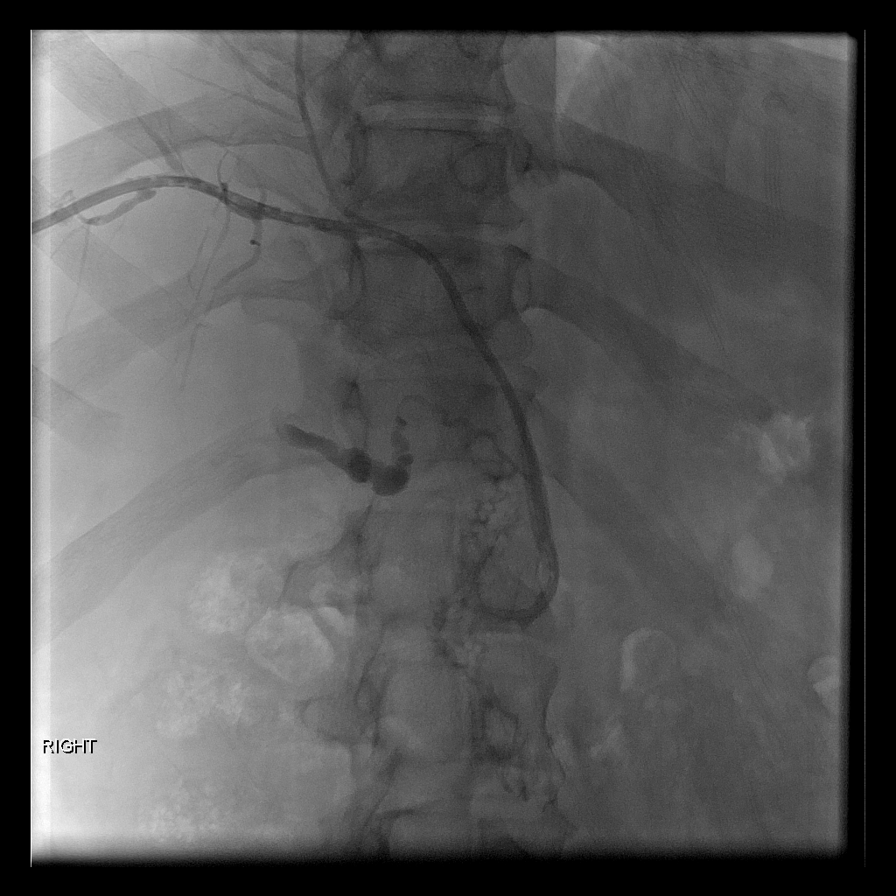

[Series 4: single · 1 of 1 slices shown (4 of 5)]
[im 1/1]
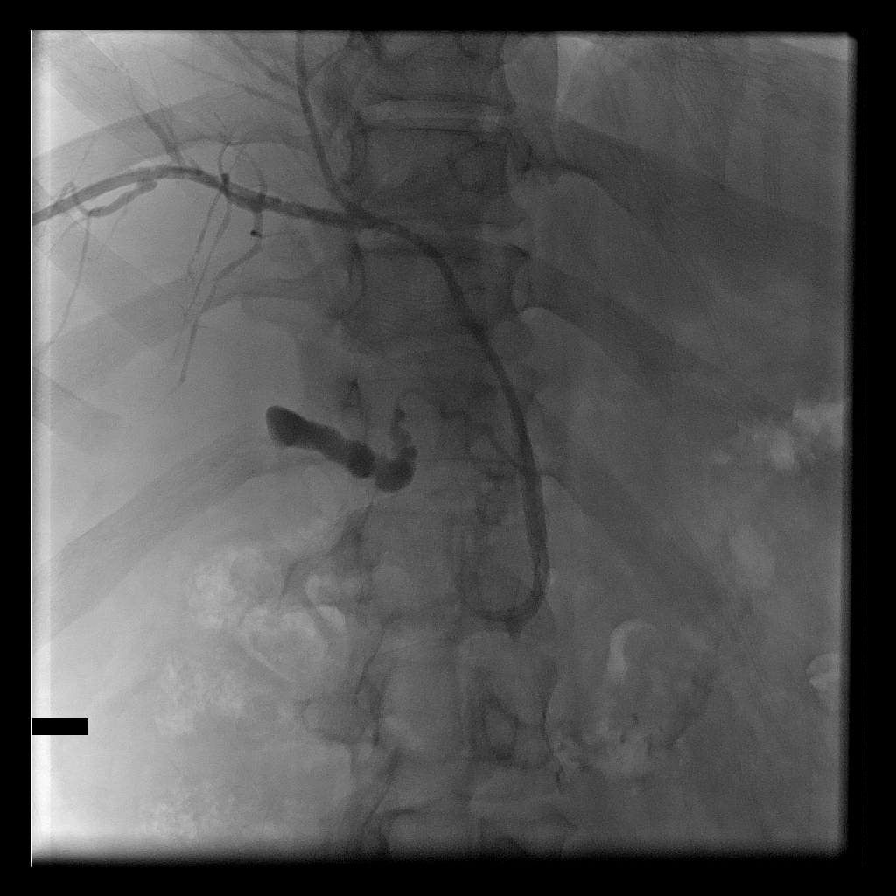

[Series 5: single · 1 of 1 slices shown (5 of 5)]
[im 1/1]
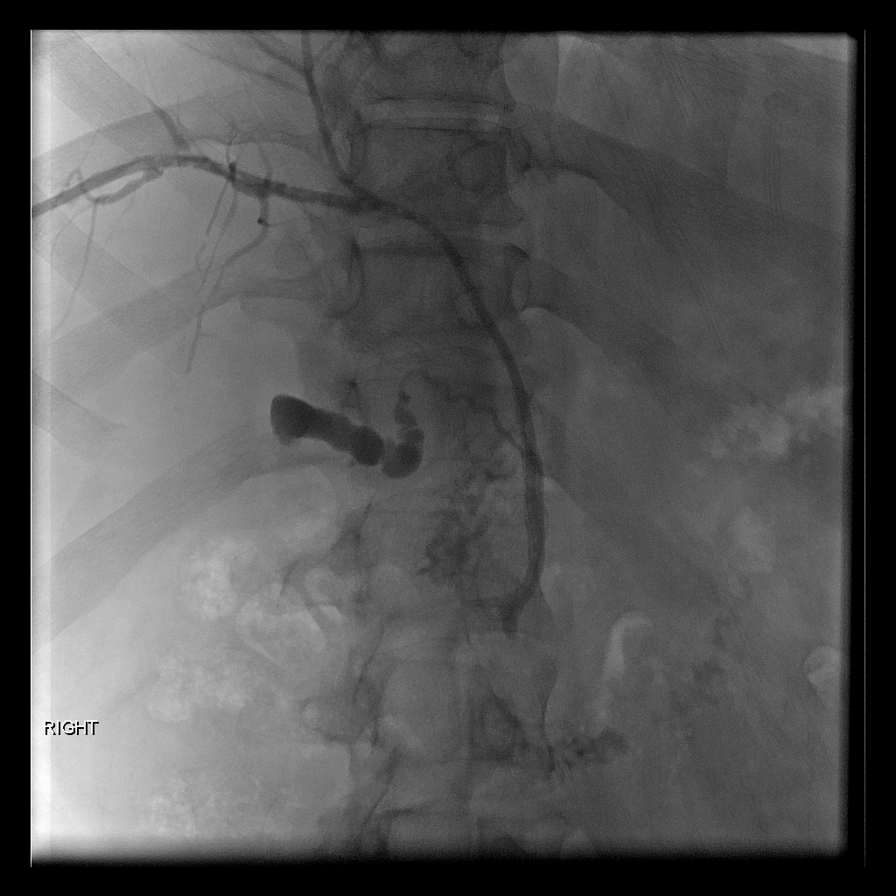

[6 of 6 positions shown; findings below may reference images not displayed]

FINDINGS: Small amount of contrast was gently  administered in the biliary
drainage tube. Biliary tube is coiled in the distal common bile duct.
Intrahepatic and extra hepatic biliary system is decompressed. Cystic duct
is patent. Gallbladder nondistended. Easy emptying into the duodenum
present. Tube was capped for internal drainage.
IMPRESSION: Successful placement of biliary tube to internal drainage.
Discharge instructions were given to the patient.

## 2014-10-03 NOTE — Discharge Summary (Signed)
PATIENT NAME:  William Everett, William Everett MR#:  [id number] DATE OF BIRTH:  1965/12/07  DATE OF ADMISSION:  12/04/2011 DATE OF DISCHARGE:  12/19/2011  DIAGNOSES:  1. Stage IV pancreatic cancer, biliary obstruction, had ERCP x 2, then had external biliary drainage by radiology.  2. Pancreatic cancer with liver metastasis, severe pain issues, requiring morphine sulfate patient-controlled analgesia plus continuous infusion.   HISTORY OF PRESENT ILLNESS: Patient is a 49 year old gentleman who was admitted to the hospital on 06/20 by Dr. Inez Pilgrim for complaints of severe right upper quadrant and epigastric pain which was very poorly controlled despite high dose of MS Contin and Roxanol at home. Patient has known history of stage IV pancreatic cancer and progressive jaundice. Pain was severe enough that patient could not sleep or eat well. It also hurt him when he was trying to move.   PAST MEDICAL HISTORY, FAMILY HISTORY, SOCIAL HISTORY, HOME MEDICATIONS, REVIEW OF SYSTEMS, PHYSICAL EXAM, AND LABS: Refer to history and physical note for details.   HOSPITAL COURSE: Bilirubin was 19.5 on admission, AST 97, ALT 167, alkaline phosphatase mildly elevated, creatinine was normal. Patient was evaluated by Dr. Candace Cruise and he had ERCP on 06/24 which showed nonfilling intrahepatic system and had stent placement. Despite this liver function tests did not show improvement in bilirubin and on 06/26 he had another ERCP with placement of longer stent. For pain control patient was placed on morphine sulfate continuous plus PCA infusion, he required continuous dose up to 8 mg per hour and still had some pain issues. Despite ERCP x2 and stent placement patient still had no improvement in bilirubin and it went up to 22.5 and he was therefore seen by radiology and had percutaneous biliary drainage placed on 06/27. Subsequently, bilirubin showed mild improvement. Patient's pain finally settled down and was under better control and he was subsequently  switched to fentanyl patch of 200 mcg/h on 07/02 along with Roxanol and IV morphine p.r.n. On 07/03 patient still had pain issues and fentanyl dose was increased up to 50 mcg/h patch q.72h. He was continued on supportive treatment. Oral intake slowly improved. Pain came under better control. 07/05 patient was doing better, pain was under control, he was eating better. Bilirubin remained stable at 18.7, creatinine normal at 0.69. Also it was discussed in detail with patient regarding that it possibly may be component of abnormal liver function could be from metastatic involvement of liver by pancreatic cancer and further options were discussed including pursuing palliative care and hospice versus trying next line palliative chemotherapy. Patient wanted to continue on chemotherapy and therefore he was given does adjusted cycle one day one gemcitabine of 600 mg/sq m (dose was capped to adjusted BSA of 2.2 sq m). Patient tolerated chemotherapy well. He was discharged home on 12/19/2010 since pain was under better control.   DISCHARGE MEDICATIONS:  1. Fentanyl 250 mcg/h transdermal q.72 hours. 2. Roxanol 10 to 20 mg q.1-2h. p.r.n. for pain.  3. Cholestyramine 4 grams orally b.i.d.  4. Hydroxyzine 25 mg q.i.d. p.r.n. for itching.  5. MiraLax 17 grams one packet daily p.r.n. constipation. 6. Ondansetron 4 mg every four hours p.r.n. for nausea. 7. Phenergan 25 mg q.6 hours p.r.n. for nausea.  8. Dukes mouthwash 5 mL q.i.d. p.r.n. for soreness in mouth or throat. 9. Docusate 100 mg b.i.d. p.r.n. for constipation. 10. Insulin aspart NovoLog 8 units subcutaneous t.i.d./a.c.  11. Lantus insulin 20 units sub-Q at bedtime.  12. Metformin 500 mg b.i.d. with meals.   DISCHARGE  DIET: Diabetic diet.   ACTIVITY: As tolerated, use cane or walker for support.   FOLLOW UP: Follow up with Dr. Ma Hillock at [facility] on 07/10 with labs including CBC, MET-C and plan next dose of chemotherapy (gemcitabine).    ____________________________ Rhett Bannister Ma Hillock, MD srp:cms D: 01/01/2012 12:02:12 ET T: 01/01/2012 14:17:32 ET JOB#: [id number]  cc: Ilia Dimaano R. Ma Hillock, MD, <Dictator>  Alveta Heimlich MD ELECTRONICALLY SIGNED 01/01/2012 14:47

## 2014-10-08 NOTE — Consult Note (Signed)
Chief Complaint:   Subjective/Chief Complaint Please see full GI consult.  Patient seen and examined.  Chart reviewed.  Patient presenting with abdominal pain and development of increasing jaundice in the setting  of Prevous h/o metastatic pancreatic cancer.  Also h/o hepatitis C.  Liver biopsy several days ago showing mixed impression fo steatohepatosis, ductular reaction and cholestasis.  MRI last week showing apparently new liver lesion in the left lobe. Mostly direct hyperbilirubinemia without extrahepatic ductal dilation and minimal intrahepatic ductal dilation.   Pain generalized in the abdomen, however moreso on the flanks.  My concern is for intrahepatic congestion of a multifactorial nature (fatty liver hepatitis c, ductular disease) and possible new mass of different etiology.  Currently lfts have improved from yesterday to today.  Continue observation.  Could consider ERCP if there is mo improvement to better define the ductal anatomy, egd for continued abdominal pain.  BID ppi.  Discussed with Dr Lorre NickGittin.  Following.   Electronic Signatures: Barnetta ChapelSkulskie, Shequita Peplinski (MD)  (Signed 21-Jun-13 22:21)  Authored: Chief Complaint   Last Updated: 21-Jun-13 22:21 by Barnetta ChapelSkulskie, Ceira Hoeschen (MD)

## 2014-10-08 NOTE — Consult Note (Signed)
Chief Complaint:   Subjective/Chief Complaint continues with generalized abdominal pain, mostly ruq/luq.  mild nausea, no vomiting some loose stool   VITAL SIGNS/ANCILLARY NOTES: **Vital Signs.:   23-Jun-13 14:00   Vital Signs Type Routine   Temperature Temperature (F) 97.2   Celsius 36.2   Temperature Source oral   Pulse Pulse 97   Pulse source per vital sign device   Respirations Respirations 20   Systolic BP Systolic BP 802   Diastolic BP (mmHg) Diastolic BP (mmHg) 76   Mean BP 93   BP Source vital sign device   Pulse Ox % Pulse Ox % 97   Pulse Ox Activity Level  At rest   Oxygen Delivery Room Air/ 21 %   Brief Assessment:   Cardiac Regular    Respiratory clear BS    Gastrointestinal details normal Bowel sounds normal  No rebound tenderness  obese, some generalized distension, tender to palpation   Lab Results: Hepatic:  23-Jun-13 06:36    Bilirubin, Total  18.9   Alkaline Phosphatase  258   SGPT (ALT)  85 (12-78 NOTE: NEW REFERENCE RANGE 05/09/2011)   SGOT (AST)  92   Total Protein, Serum 6.9   Albumin, Serum  2.4  Routine Chem:  23-Jun-13 06:36    Glucose, Serum  199   BUN 7   Creatinine (comp)  0.56   Sodium, Serum  131   Potassium, Serum 3.9   Chloride, Serum  95   CO2, Serum 26   Calcium (Total), Serum 8.8   Osmolality (calc) 266   eGFR (African American) >60   eGFR (Non-African American) >60 (eGFR values <64m/min/1.73 m2 may be an indication of chronic kidney disease (CKD). Calculated eGFR is useful in patients with stable renal function. The eGFR calculation will not be reliable in acutely ill patients when serum creatinine is changing rapidly. It is not useful in  patients on dialysis. The eGFR calculation may not be applicable to patients at the low and high extremes of body sizes, pregnant women, and vegetarians.)   Result Comment LABS - This specimen was collected through an   - indwelling catheter or arterial line.  - A minimum of 555m of  blood was wasted prior    - to collecting the sample.  Interpret  - results with caution.  Result(s) reported on 07 Dec 2011 at 08:46AM.   Anion Gap 10  Routine Sero:  21-Jun-13 23:54    Occult Blood, Feces POSITIVE (Result(s) reported on 06 Dec 2011 at 12:57AM.)   Assessment/Plan:  Assessment/Plan:   Assessment 1) abnormal lfts in the setting of known history fo pancreatic Ca.  Patient has undergone chemotherapy for this , with ct done 11/18/11 showing no residual disease. progressive abnormality of lfts looking like obstructive jaundice, but on dilation of intra or extrahepatic bile ducts on MRCP done 11/26/11. On that study there is evidenc of possible new mass in the left lobe of the liver and possible pancreatic tail.  AFP  and CEA drawn, pending.    Plan 1) may need to have repeat imaging, recommend triphasic CT of abdomen.  Will discuss possible ERCP with Dr OhCandace Cruiseomorrow, may also need to do EGD.  Continue bid ppi. discussed with Dr GiCynda Acres  Electronic Signatures: SkLoistine SimasMD)  (Signed 23-Jun-13 16:51)  Authored: Chief Complaint, VITAL SIGNS/ANCILLARY NOTES, Brief Assessment, Lab Results, Assessment/Plan   Last Updated: 23-Jun-13 16:51 by SkLoistine SimasMD)

## 2014-10-08 NOTE — Consult Note (Signed)
Chief Complaint:   Subjective/Chief Complaint Overall feels better. Tolerating diet.   VITAL SIGNS/ANCILLARY NOTES: **Vital Signs.:   29-Jun-13 00:35   Vital Signs Type Q 4hr   Temperature Temperature (F) 98.4   Celsius 36.8   Temperature Source Oral   Pulse Pulse 96   Pulse source per vital sign device   Respirations Respirations 20   Systolic BP Systolic BP 518   Diastolic BP (mmHg) Diastolic BP (mmHg) 77   Mean BP 94   Pulse Ox % Pulse Ox % 96   Pulse Ox Activity Level  At rest   Oxygen Delivery 2L    05:34   Vital Signs Type Routine   Temperature Temperature (F) 98.2   Celsius 36.7   Temperature Source Oral   Pulse Pulse 98   Respirations Respirations 20   Systolic BP Systolic BP 841   Diastolic BP (mmHg) Diastolic BP (mmHg) 84   Mean BP 103   Pulse Ox % Pulse Ox % 95   Pulse Ox Activity Level  At rest   Oxygen Delivery 2L    07:47   Vital Signs Type POCT   Nurse Fingerstick (mg/dL) FSBS (fasting range 65-99 mg/dL) 173   Comments/Interventions  Nurse Notified    11:18   Vital Signs Type POCT   Nurse Fingerstick (mg/dL) FSBS (fasting range 65-99 mg/dL) 205   Comments/Interventions  Nurse Notified   Brief Assessment:   Additional Physical Exam Deeply jaundiced. Abdomen is distended but benign. Drain is draining.   Lab Results: Hepatic:  29-Jun-13 05:25    Bilirubin, Total  20.7   Alkaline Phosphatase  254   SGPT (ALT)  83 (12-78 NOTE: NEW REFERENCE RANGE 05/09/2011)   SGOT (AST)  88   Total Protein, Serum 6.9   Albumin, Serum  2.2  Routine Chem:  29-Jun-13 05:25    Glucose, Serum  148   BUN 9   Creatinine (comp) 0.64   Sodium, Serum  135   Potassium, Serum 4.2   Chloride, Serum 100   CO2, Serum 26   Calcium (Total), Serum 8.9   Osmolality (calc) 272   eGFR (African American) >60   eGFR (Non-African American) >60 (eGFR values <4m/min/1.73 m2 may be an indication of chronic kidney disease (CKD). Calculated eGFR is useful in patients with  stable renal function. The eGFR calculation will not be reliable in acutely ill patients when serum creatinine is changing rapidly. It is not useful in  patients on dialysis. The eGFR calculation may not be applicable to patients at the low and high extremes of body sizes, pregnant women, and vegetarians.)   Anion Gap 9   Result Comment LABS - This specimen was collected through an   - indwelling catheter or arterial line.  - A minimum of 557m of blood was wasted prior    - to collecting the sample.  Interpret  - results with caution.  Result(s) reported on 13 Dec 2011 at 06:26AM.  Routine Hem:  29-Jun-13 05:25    WBC (CBC)  17.8   RBC (CBC)  3.60   Hemoglobin (CBC)  11.4   Hematocrit (CBC)  34.2   Platelet Count (CBC) 187   MCV 95   MCH 31.5   MCHC 33.2   RDW  17.1   Neutrophil % 79.1   Lymphocyte % 8.8   Monocyte % 10.4   Eosinophil % 1.2   Basophil % 0.5   Neutrophil #  14.1   Lymphocyte # 1.6   Monocyte #  1.9   Eosinophil # 0.2   Basophil # 0.1   Assessment/Plan:  Assessment/Plan:   Assessment Pancreatic cancer with biliary obstruction s/o external drain insertion. Drain seem to be working and his bilirubin is lower today. No fever and leucocytosis is improving.    Plan Will continue current management. Repeat labs in am.   Electronic Signatures: Jill Side (MD)  (Signed 29-Jun-13 11:39)  Authored: Chief Complaint, VITAL SIGNS/ANCILLARY NOTES, Brief Assessment, Lab Results, Assessment/Plan   Last Updated: 29-Jun-13 11:39 by Jill Side (MD)

## 2014-10-08 NOTE — Consult Note (Signed)
Chief Complaint:   Subjective/Chief Complaint No complaints.   VITAL SIGNS/ANCILLARY NOTES: **Vital Signs.:   30-Jun-13 06:19   Vital Signs Type Routine   Temperature Temperature (F) 98.4   Celsius 36.8   Temperature Source Oral   Pulse Pulse 95   Respirations Respirations 20   Systolic BP Systolic BP 924   Diastolic BP (mmHg) Diastolic BP (mmHg) 85   Mean BP 100   Pulse Ox % Pulse Ox % 96   Pulse Ox Activity Level  At rest   Oxygen Delivery Room Air/ 21 %   Brief Assessment:   Additional Physical Exam Biliary drain with proper drainage, Abdomen is soft and benign.   Lab Results: Hepatic:  30-Jun-13 05:30    Bilirubin, Total  18.1   Bilirubin, Direct  14.50 (Result(s) reported on 14 Dec 2011 at 07:09AM.)   Alkaline Phosphatase  229   SGPT (ALT) 74 (12-78 NOTE: NEW REFERENCE RANGE 05/09/2011)   SGOT (AST)  80   Total Protein, Serum 6.8   Albumin, Serum  2.0  Routine Hem:  30-Jun-13 05:30    WBC (CBC)  18.2   RBC (CBC)  3.44   Hemoglobin (CBC)  10.7   Hematocrit (CBC)  32.5   Platelet Count (CBC) 188   MCV 95   MCH 31.0   MCHC 32.8   RDW  16.7   Neutrophil % 78.1   Lymphocyte % 9.8   Monocyte % 10.4   Eosinophil % 1.4   Basophil % 0.3   Neutrophil #  14.4   Lymphocyte # 1.8   Monocyte #  1.9   Eosinophil # 0.2   Basophil # 0.1 (Result(s) reported on 14 Dec 2011 at 07:15AM.)   Assessment/Plan:  Assessment/Plan:   Assessment Stage 4 pancreatic cancer with progression. Biliary obstruction. Internal stenting failed. S/P PTC and draining well. Bilirubin slowly improving. Leucocytosis.    Plan Continue present management. LFT's in am. Will follow.   Electronic Signatures: Jill Side (MD)  (Signed 30-Jun-13 09:56)  Authored: Chief Complaint, VITAL SIGNS/ANCILLARY NOTES, Brief Assessment, Lab Results, Assessment/Plan   Last Updated: 30-Jun-13 09:56 by Jill Side (MD)

## 2014-10-08 NOTE — Consult Note (Signed)
Chief Complaint:   Subjective/Chief Complaint Less abd pain and itching today but LFT no better.   VITAL SIGNS/ANCILLARY NOTES: **Vital Signs.:   27-Jun-13 04:37   Vital Signs Type Routine   Temperature Temperature (F) 98.1   Celsius 36.7   Temperature Source Oral   Pulse Pulse 91   Pulse source per vital sign device   Respirations Respirations 18   Systolic BP Systolic BP 102   Diastolic BP (mmHg) Diastolic BP (mmHg) 60   Mean BP 73   BP Source vital sign device   Pulse Ox % Pulse Ox % 94   Pulse Ox Activity Level  At rest   Oxygen Delivery Room Air/ 21 %   Brief Assessment:   Cardiac Regular    Respiratory clear BS    Gastrointestinal RUQ tenderness   Lab Results: Hepatic:  27-Jun-13 05:33    Bilirubin, Total  21.2   Alkaline Phosphatase  265   SGPT (ALT)  81 (12-78 NOTE: NEW REFERENCE RANGE 05/09/2011)   SGOT (AST)  100   Total Protein, Serum 6.7   Albumin, Serum  2.2  Routine Chem:  27-Jun-13 05:33    Glucose, Serum  136   BUN 8   Creatinine (comp) 0.72   Sodium, Serum  134   Potassium, Serum 4.1   Chloride, Serum 101   CO2, Serum 27   Calcium (Total), Serum 8.8   Osmolality (calc) 269   eGFR (African American) >60   eGFR (Non-African American) >60 (eGFR values <48m/min/1.73 m2 may be an indication of chronic kidney disease (CKD). Calculated eGFR is useful in patients with stable renal function. The eGFR calculation will not be reliable in acutely ill patients when serum creatinine is changing rapidly. It is not useful in  patients on dialysis. The eGFR calculation may not be applicable to patients at the low and high extremes of body sizes, pregnant women, and vegetarians.)   Anion Gap  6   Assessment/Plan:  Assessment/Plan:   Assessment Obstructive jaundice. So far no relief from biliary stenting.    Plan Will contact IR to see if they can place external biliary drainage.   Electronic Signatures: OVerdie Shire(MD)  (Signed 27-Jun-13  08:10)  Authored: Chief Complaint, VITAL SIGNS/ANCILLARY NOTES, Brief Assessment, Lab Results, Assessment/Plan   Last Updated: 27-Jun-13 08:10 by OVerdie Shire(MD)

## 2014-10-08 NOTE — Consult Note (Signed)
Chief Complaint:   Subjective/Chief Complaint Pain better after being on PCA pump. LFT still high.   VITAL SIGNS/ANCILLARY NOTES: **Vital Signs.:   25-Jun-13 11:30   Vital Signs Type Q 4hr   Temperature Temperature (F) 98.2   Celsius 36.7   Temperature Source oral   Pulse Pulse 89   Pulse source per vital sign device   Respirations Respirations 20   Systolic BP Systolic BP 951   Diastolic BP (mmHg) Diastolic BP (mmHg) 70   Mean BP 84   BP Source vital sign device   Pulse Ox % Pulse Ox % 96   Pulse Ox Activity Level  At rest   Oxygen Delivery Room Air/ 21 %   Brief Assessment:   Cardiac Regular    Respiratory clear BS    Gastrointestinal RUQ tenderness   Lab Results: Hepatic:  25-Jun-13 05:44    Bilirubin, Total  18.9   Bilirubin, Direct  14.90 (Result(s) reported on 09 Dec 2011 at 06:34AM.)   Alkaline Phosphatase  256   SGPT (ALT)  83 (12-78 NOTE: NEW REFERENCE RANGE 05/09/2011)   SGOT (AST)  102   Total Protein, Serum 6.6   Albumin, Serum  2.3  Routine Chem:  25-Jun-13 05:44    Glucose, Serum  170   BUN 7   Creatinine (comp) 0.69   Sodium, Serum  132   Potassium, Serum 4.3   Chloride, Serum  97   CO2, Serum 27   Calcium (Total), Serum 8.8   Anion Gap 8   Osmolality (calc) 266   eGFR (African American) >60   eGFR (Non-African American) >60 (eGFR values <64m/min/1.73 m2 may be an indication of chronic kidney disease (CKD). Calculated eGFR is useful in patients with stable renal function. The eGFR calculation will not be reliable in acutely ill patients when serum creatinine is changing rapidly. It is not useful in  patients on dialysis. The eGFR calculation may not be applicable to patients at the low and high extremes of body sizes, pregnant women, and vegetarians.)  Routine Hem:  25-Jun-13 05:44    WBC (CBC)  15.8   RBC (CBC)  3.86   Hemoglobin (CBC)  12.0   Hematocrit (CBC)  35.9   Platelet Count (CBC) 174   MCV 93   MCH 31.1   MCHC 33.5    RDW  16.8   Neutrophil % 73.1   Lymphocyte % 15.1   Monocyte % 9.9   Eosinophil % 1.0   Basophil % 0.9   Neutrophil #  11.5   Lymphocyte # 2.4   Monocyte #  1.6   Eosinophil # 0.2   Basophil # 0.1 (Result(s) reported on 09 Dec 2011 at 06:20AM.)   Assessment/Plan:  Assessment/Plan:   Assessment Obstructive jaundice.LFT still high.    Plan REpeat LFT in AM. IF still elevated, then will repeat ERCP tomorrow afternoon to place longer biliary stent. (Longer stent available now). Consider PTC with IR to drain the other intrahepatic system. Thanks.   Electronic Signatures: OVerdie Shire(MD)  (Signed 25-Jun-13 13:28)  Authored: Chief Complaint, VITAL SIGNS/ANCILLARY NOTES, Brief Assessment, Lab Results, Assessment/Plan   Last Updated: 25-Jun-13 13:28 by OVerdie Shire(MD)

## 2014-10-08 NOTE — Consult Note (Signed)
Asked by IR to pull biliary stent that was placed yest to allow biliary catheterow guidewire into duodenum. Stent almost completely out. Only prox tip inside ampulla. This was grabbed with forceps and then pulled out with snare. Then used duodenoscope to visualize ampulla. No catheterguidewire seen to pull into duodenum. External biliary drainage for now. Rest of recommendation as per radiology. Thanks.   Electronic Signatures: Lutricia Feilh, Ayako Tapanes (MD) (Signed on 27-Jun-13 14:12)  Authored   Last Updated: 27-Jun-13 14:19 by Lutricia Feilh, Cruz Bong (MD)

## 2014-10-08 NOTE — Consult Note (Signed)
Chief Complaint and History:   Referring Physician Dr. Gitten    Chief CompNeale Burlylaint uncontrolled diabetes   Allergies:  No Known Allergies:   Assessment/Plan:   Assessment/Plan This is a 49 yo M with pancreatic cancer and hepC. He has a hisotry of diabetes. He is known to me from a prior admission in 02/2011 at which time he had uncontrolled diabetes and an abscess. At that time, sugars were reasonably controlled on Lantus 50 units qHS and NovoLog 40 units qAC + SSI and metformin 1000 mg bid. He explains that he stopped the insulin about 2 months ago. He did not feel it was needed. His sugars were in the 90-150 range on metformin alon until a few weeks ago. He blames higher sugars recently on his liver condition. His appetite is poor but he is eating 100% of his meal trays. He is currently receiving a NovoLog modified sliding scale. Sugars are in the 300s and he has received a total of 10 units of NovoLog in the last 24 hours.  Pt was examined and chart was reviewed.  A/ 1. Uncontrolled diabetes 2. Pancreatic cancer  P/ 1. Add metformin 500 mg bid. Could be titrated up to 1000 mg bid for better effect, if needed. This will need to be held for 48 hrs after CT scans. 2. Add Lantus 20 units qHS. 3. Add NovoLog 8 units tid AC. 4. Add NovoLog SSI.  Full consult will be dictated.   Electronic Signatures: Raj JanusSolum, Brittannie Tawney M (MD)  (Signed 21-Jun-13 13:23)  Authored: Chief Complaint and History, ALLERGIES, Assessment/Plan   Last Updated: 21-Jun-13 13:23 by Raj JanusSolum, Makara Lanzo M (MD)

## 2014-10-08 NOTE — Op Note (Signed)
PATIENT NAME:  William Everett, William Everett MR#:  [id number] DATE OF BIRTH:  06-Jun-1966  DATE OF PROCEDURE:  07/17/2011  PREOPERATIVE DIAGNOSIS: Anal pain, possible fissure.    POSTOPERATIVE DIAGNOSIS: Chronic anal fissure.   PROCEDURES PERFORMED:  1. Examination under anesthesia of the anus.  2. Closed lateral internal sphincterotomy on the right.   SURGEON: Haroldine Redler A. Egbert GaribaldiBird, MD  ANESTHESIA: Heavy intravenous sedation and local anesthesia.   FINDINGS: Chronic posterior anal fissure.   SPECIMENS: None.   ESTIMATED BLOOD LOSS: Minimal.   DESCRIPTION OF PROCEDURE: With the patient in the prone jackknife position and percutaneous monitoring lines placed, heavy intravenous sedation was administered by anesthesia with a combination of fentanyl, ketamine, Versed and propofol. The patient's buttocks were spread apart with the help of tape to the bed. The anal region was sterilely prepped and draped with Betadine solution. Digital rectal examination was performed. A posterior midline anal fissure was encountered. The anal sphincter was tight.   A generous field block was created circumferentially around the anal region with a total of 30 mL of 0.5% Marcaine with epinephrine as well as a total of 30 mL of 1% lidocaine plain. The groove between the internal and external sphincter on the right side was identified by digital palpation with the index finger of my left hand in the anal canal, a #11 blade was placed in the groove between the internal and external sphincter. The knife was then inserted, turned 90 degrees so that the bladed portion was towards the anal canal and the sphincter was then divided under palpation. Remaining fibers were divided with gentle pressure on the sphincter from inside the anal canal. Pressure was then held for several minutes. Hemostasis appeared to be obtained. Anal packing of Gelfoam with Avitene powder was placed inside the anal canal. There was no signs of external or internal hemorrhoids.  Patient was then returned supine, taken to the recovery room in stable and satisfactory condition by anesthesia service.   ____________________________ Redge GainerMark A. Egbert GaribaldiBird, MD mab:cms D: 07/17/2011 23:34:57 ET T: 07/18/2011 11:29:25 ET JOB#: [id number]  cc: Loraine LericheMark A. Egbert GaribaldiBird, MD, <Dictator> Shahzain Kiester A Kallista Pae MD ELECTRONICALLY SIGNED 07/21/2011 10:21

## 2014-10-08 NOTE — Consult Note (Signed)
PATIENT NAME:  William Everett, William Everett MR#:  [id number] DATE OF BIRTH:  February 25, 1966  DATE OF CONSULTATION:  12/05/2011  REFERRING PHYSICIAN:  Barbette Reichmann, MD  CONSULTING PHYSICIAN:  A. Lavone Orn, MD  CHIEF COMPLAINT: Uncontrolled diabetes.   HISTORY OF PRESENT ILLNESS: This is a 49 year old male with a history of pancreatic cancer admitted yesterday from clinic due to uncontrolled abdominal pain. He has a history of diabetes and I am aware of the patient from a prior hospitalization in September 2012 when he also had uncontrolled diabetes in the setting of an abscess. During that hospitalization, we achieved reasonable glycemic control on a regimen of Lantus 50 units at bedtime, NovoLog 40 units q.a.c. plus a sliding scale, and metformin 1000 mg b.i.d. The patient never followed up with me in clinic after that hospitalization. He recalls he was able to stop his insulin around November of 2012 once his blood sugars had improved. He explains blood sugars were generally reasonable and in the 90 to 100 range over the last several months until just a few weeks ago. He was compliant with his metformin, however, had to hold it from time to time for CT scans. In the last two weeks, sugars have been running higher. In the last 24 hours of his hospitalization, sugars have been in the 300's. He has received 10 units on his NovoLog sliding scale. He has not had a recent hemoglobin A1c. The patient denies any polyuria, polydipsia, or blurred vision. Denies any recent change in weight. He reports his appetite is poor although he is eating about 100% of his meal trays.   PAST MEDICAL HISTORY:  1. Pancreatic cancer.  2. History of Hepatitis C.  3. Hiatal hernia.  4. Peptic ulcer disease.  5. Obstructive sleep apnea.  6. Diabetes mellitus.  7. Right thigh and perineal abscess, status post I and D 02/2011.   ALLERGIES: No known drug allergies.   SOCIAL HISTORY: The patient is married. He denies tobacco or alcohol use.    CURRENT INPATIENT MEDICATIONS:  1. Questran 4 grams b.i.d.  2. Docusate 100 mg b.i.d.  3. Lactulose 15 mL (10 grams) q.12 hours.  4. Morphine ER 120 mg daily.  5. NovoLog insulin sliding scale to target a blood sugar of 300.  6. Normal saline 0.9% plus 20 KCl at 100 mL/h.   FAMILY HISTORY: A grandmother had diabetes. No known malignancies.   REVIEW OF SYSTEMS: GENERAL: No weight loss. He does report fatigue. HEENT: Denies blurred vision. Denies sore throat. NECK: No neck pain. No dysphasia. CARDIAC: No chest pain, no palpitations. PULMONARY: No cough. He does have dyspnea on exertion. ABDOMEN: Reports diffuse abdominal pain and poor appetite. EXTREMITIES: Denies leg swelling. SKIN: Denies rash. He does complain of diffuse pruritus. ENDOCRINE: No heat or cold intolerance. SKIN: No easy bruisability or recent bleeding.   PHYSICAL EXAMINATION:   VITAL SIGNS: Temperature 98.8, respirations 18, pulse 80, blood pressure 122/75, pulse oximetry 96% on room air.   GENERAL: Obese African American male in no acute distress.   HEENT: Scleral icterus is present. Oropharynx is clear. Mucous membranes moist.   NECK: Supple. No thyromegaly. No thyroid bruit.   PULMONARY: Clear bilaterally. No wheeze.   CARDIAC: Regular rate and rhythm without murmur.   ABDOMEN: Diffusely soft, nondistended. No evidence of ascites.   EXTREMITIES: No edema is present.   MUSCULOSKELETAL: Normal motor tone throughout.   SKIN: No rash.   LABORATORY, DIAGNOSTIC, AND RADIOLOGICAL DATA: Glucose 318, BUN 9, creatinine 0.73,  sodium 132, potassium 4.1, chloride 96, eGFR greater than 60, calcium 9.1, albumin 2.6, total bilirubin 17.9, AST 87, ALT 97.   ASSESSMENT: This is a 49 year old male with uncontrolled diabetes in part due to his pancreatic cancer and likely lack of endogenous insulin secretion.   RECOMMENDATIONS:  1. Continue metformin for now, although it should continue to be held for 48 hours after any  planned CT scans.  2. Add basal insulin starting with Lantus 20 units at bedtime. Titrate up the basal insulin by 3 to 5 units every 2 to 3 days to achieve a fasting sugar of less than 150. 3. Add NovoLog 8 units at meals. Dose can be adjusted upward if his sugars other than fasting, remain elevated above 180. I would increase the dose by 2 to 5 units daily as needed.  4. Adjust the sliding scale to target a blood sugar of 150, provide this q.a.c. and at bedtime.   I am available for outpatient follow-up. I will be unavailable to see the patient over the next week as I will be out-of-town.    Thank you for the kind request for consultation.   ____________________________ A. Lavone Orn, MD ams:drc D: 12/05/2011 17:23:13 ET T: 12/05/2011 18:14:12 ET JOB#: [id number]  cc: A. Lavone Orn, MD, <Dictator> Sherlon Handing MD ELECTRONICALLY SIGNED 12/17/2011 12:33

## 2014-10-08 NOTE — H&P (Signed)
PATIENT NAME:  Yvone NeuKING, Jermond B MR#:  [id number] DATE OF BIRTH:  1966/05/31  DATE OF ADMISSION:  12/04/2011  HISTORY AND PHYSICAL:  Mr. Brooke DareKing is a 49 year old patient who is primarily followed by Dr. Sherrlyn HockPandit in the [facility] and whom I saw in the Cancer Center today and admitted him for uncontrolled pain for inpatient control. Even on oral Roxanol at home p.r.n. and oral MS Contin, he could not get any pain control with significant mid abdominal, upper abdominal, and some right upper quadrant pain sometime radiating to the back, worse on trying to eat, so that he has been doubled over. He can't sleep. It hurts him to get up and move. He is fairly immobile from the pain. The patient has a significant history of pancreatic cancer with liver metastasis, prior chemotherapy up until February of this year, recently progressive liver functions and increase tumor marker and positive hepatitis C. When ultrasound, CT, and MRCP failed to show definite liver metastasis, he had a gastrointestinal evaluation and then had a liver biopsy 48 hours ago to determine if there are nonmalignant causes of his progressive jaundice. On this background, he has also had chronic abdominal pain on narcotics, but in the last three or so weeks that pain and pain medication requirement has been exacerbated, culminating in the events of today. He has not been vomiting and has not had diarrhea. He has had some constipation. He has passed some gas. He did have a bowel movement yesterday. He has mild headaches intermittently. No visual disturbances. No focal weakness. No ear or jaw pain. No retrosternal chest pain or palpitations. No shortness of breath on rest but he is short of breath on mild exertion. No focal weakness. No extremity edema.   PAST MEDICAL HISTORY:  1. Hepatitis C for which he took interferon treatment in the past. 2. Gastric ulcers.  3. Hiatal hernia. 4. Throat polyp. 5. Heels and wrists surgeries.  6. Obstructive sleep  apnea. 7. His cancer of the pancreas was diagnosed by liver biopsy in March 2012 and FOLFIRINOX? treatment was given 15 cycles and then four additional cycles with FOLFIRI.   FAMILY HISTORY: Noncontributory.   SOCIAL HISTORY:  He was a smoker, quit in 2005. He was an alcohol drinker who quit in November 2011.   ALLERGIES: No known allergies.   HOME MEDICATIONS: 1. MS Contin 90 mg p.o. q. 8 hours.  2. Cholestyramine 4 grams p.o. twice a day.  3. Hydroxyzine 25 mg p.o. q.i.d. p.r.n.  4. Roxanol 10-20 mg, but on one occasion took a 40- mg dose, every two hours p.r.n. for pain 5. Prilosec 20 mg p.o. daily.  6. MiraLAX one packet daily.  7. Phenergan 25 mg every six hours p.r.n.  8. Zofran 4 mg every four hours p.r.n.  9. Duke's mouthwash p.r.n.  10. Colace 100 mg b.i.d.   PHYSICAL EXAMINATION:  GENERAL: The patient was alert and cooperative, somewhat anxious, in obvious distress with abdominal pain. He is afebrile.   HEENT: Sclerae jaundiced.   LUNGS: Clear. No wheezing or rales.   ABDOMEN: Obese with some tenderness in the mid abdomen and the right upper quadrant. There is no bruising.   EXTREMITIES: Trace pedal and pretibial edema.   LYMPH NODES: Not palpable in the neck or supraclavicular region.   LABORATORY, DIAGNOSTIC, AND RADIOLOGICAL DATA: Bilirubin was 19.5. Alkaline phosphatase only mildly elevated. SGOT 97, SGPT 167. CBC is unremarkable. His creatinine is normal. Potassium is 3.4.   IMPRESSION AND PLAN: The  patient is with prior proven pancreatic disease metastatic to liver and then chemotherapy until February with improvement and reduction in tumor markers. Then recently tumor markers are rising and he has become jaundiced, but no bulk disease seen on scanning. Recently on MRCP there is a 2.5-cm liver mass, but there is no diffuse liver disease. Residual pancreatic mass has not increased. There is recent documentation of hepatitis C positivity and the patient had a biopsy  two days ago to look for nonmalignant causes of his jaundice. He has also had recent hyperglycemia and seen Dr. Tedd Sias. He had been on and then off of metformin. His glucose now is currently over 300 even though he is not eating much. He has a [employer] manager.  He is now hospitalized for uncontrolled pain.   PLAN: We will hospitalize the patient. We will start normal saline with a little bit of potassium at 100 mL an hour. We will use Roxanol 20 mg p.o. q. 2 hours p.r.n. and morphine 2 mg IV q.1 hour p.r.n. for breakthrough, titrate up as needed. Continue MS Contin 90 mg p.o. q. 8 hours. We will hold Phenergan. We will hold Zofran. We will add lactulose. We will continue Prilosec. We will hold hydroxyzine. We will get the results of the liver biopsy. We will consult GI. We will follow the liver panel. We will put him on sliding scale insulin. We will consult endocrinology.      ____________________________ Knute Neu Lorre Nick, MD rgg:bjt D: 12/04/2011 19:49:09 ET T: 12/05/2011 07:32:00 ET JOB#: [id number]  cc: Knute Neu. Lorre Nick, MD, <Dictator> Marin Roberts MD ELECTRONICALLY SIGNED 12/17/2011 8:52

## 2014-10-08 NOTE — Consult Note (Signed)
Chief Complaint:   Subjective/Chief Complaint no n/v, continues with abdominal pain but improved, bm times one today.  appetite ok.   VITAL SIGNS/ANCILLARY NOTES: **Vital Signs.:   01-Jul-13 11:03   Temperature Temperature (F) 99.2   Celsius 37.3   Temperature Source Oral   Pulse Pulse 105   Respirations Respirations 20   Systolic BP Systolic BP 016   Diastolic BP (mmHg) Diastolic BP (mmHg) 82   Mean BP 108   Pulse Ox % Pulse Ox % 94   Pulse Ox Activity Level  At rest   Oxygen Delivery Room Air/ 21 %   Brief Assessment:   Cardiac Regular    Respiratory clear BS    Gastrointestinal details normal mild distension, bs positive, diffuse tenderness especially ruq.   Lab Results: Hepatic:  01-Jul-13 04:43    Bilirubin, Total  16.6   Bilirubin, Direct  13.80 (Result(s) reported on 15 Dec 2011 at 05:20AM.)   Alkaline Phosphatase  205   SGPT (ALT) 70 (12-78 NOTE: NEW REFERENCE RANGE 05/09/2011)   SGOT (AST)  77   Total Protein, Serum 6.4   Albumin, Serum  1.9  Routine Chem:  01-Jul-13 04:43    Result Comment labs - This specimen was collected through an   - indwelling catheter or arterial line.  - A minimum of 34mls of blood was wasted prior    - to collecting the sample.  Interpret  - results with caution.  Result(s) reported on 15 Dec 2011 at 05:20AM.   Glucose, Serum  128   BUN 8   Creatinine (comp) 0.64   Sodium, Serum  135   Potassium, Serum 4.1   Chloride, Serum 100   CO2, Serum 29   Calcium (Total), Serum 8.6   Anion Gap  6   Osmolality (calc) 270   eGFR (African American) >60   eGFR (Non-African American) >60 (eGFR values <67mL/min/1.73 m2 may be an indication of chronic kidney disease (CKD). Calculated eGFR is useful in patients with stable renal function. The eGFR calculation will not be reliable in acutely ill patients when serum creatinine is changing rapidly. It is not useful in  patients on dialysis. The eGFR calculation may not be applicable to  patients at the low and high extremes of body sizes, pregnant women, and vegetarians.)  Routine Hem:  01-Jul-13 04:43    WBC (CBC)  15.0   RBC (CBC)  3.21   Hemoglobin (CBC)  10.3   Hematocrit (CBC)  30.3   Platelet Count (CBC) 179   MCV 94   MCH 32.1   MCHC 34.0   RDW  16.7   Neutrophil % 72.8   Lymphocyte % 15.5   Monocyte % 9.8   Eosinophil % 1.5   Basophil % 0.4   Neutrophil #  10.9   Lymphocyte # 2.3   Monocyte #  1.5   Eosinophil # 0.2   Basophil # 0.1   Assessment/Plan:  Assessment/Plan:   Assessment 1) stage iv metastatic pancreatic cancer. near complete biliary obstruction from the common hepatic confluence to the distal duct.  drainage not achieved with ercp, therefore PTC done via radiology.  This is draining an slow drop of bili noted.    Plan 1) continue current, no new GI recs, further tx per oncology.  will follow at a distance.   Electronic Signatures: Loistine Simas (MD)  (Signed 01-Jul-13 15:39)  Authored: Chief Complaint, VITAL SIGNS/ANCILLARY NOTES, Brief Assessment, Lab Results, Assessment/Plan   Last Updated: 01-Jul-13 15:39 by  Loistine Simas (MD)

## 2014-10-08 NOTE — Consult Note (Signed)
Pt seen and examined. Asked to evaluate patient for ERCP today. Pancreatic cancer with liver mets. If jaundice due to tumor infiltration of liver, then biliary stenting will not help. If obstruction at bifurcation, then both of left and right hepatic system will need to be drained for jaundice to resolve. This is usually difficult to do endoscopically. If obstruction at distal CBD, then biliary stenting will help. I discuss all the scenarios. I also discussed potential risks, incl bleeding, pancreatitis, and reaction to meds. Due to pt's signif abd pain, patient willing to proceed with ERCP with the hope that stenting may perhaps reduce abd pain. Will schedule this afternoon. Thanks.  Electronic Signatures: Lutricia Feilh, Raphaella Larkin (MD)  (Signed on 24-Jun-13 13:20)  Authored  Last Updated: 24-Jun-13 13:20 by Lutricia Feilh, Anacristina Steffek (MD)

## 2014-10-08 NOTE — Consult Note (Signed)
Brief Consult Note: Diagnosis: Abdominal pain.   Patient was seen by consultant.   Consult note dictated.   Comments: Appreciate consult for 49 y/o [place] with history of pancreatic cancer/liver mets treated ?folfirinox/folfiri last year, hepatitis C, and GERD for abdominal pain. Patient reports that he is in the middle of a work up to determine whether or not his cancer is back v. having liver difficulties due to hepatitis C. States he was originally diagnosed when he was 25 and shortly thereafter received interferon therapy, and was told by a physician that the treatment had worked. He has had abdominal US, MRCP recently that demonstrate concern for pancreatic malignancy and a 2cm  mass in the left lobe of the liver. A liver biopsy 2d ago, the results are pending. Regarding his complaint of abdominal pain, he reports an increase of his RUQ pain 2w ago. States the pain prevents him from lying on his back or left side: only tolerable then lying on right side. States that food increases it, but it does not radiate. States it is a burning. Occ. nausea. States that he tried taking several rounds of Advil over the last 10d to help with it. Now the whole abdomen hurts, but prime spot is RUQ. Had some bloating, but this is better now. One loose stool- but now back to his normal. Denies black tarry stools,bloody stools, indigestion, loss of appetite. Also complains of itch Impression: History of pancreatic cancer/liver mets and hepatitis C: awaiting liver bx results. No hep c viral load results- will order to see if virus active. Ca 19-9 is elevated, will add AFP and CEA due to hepatic mass. 2. Exacerbation of RUQ pain. Lipase. H pylori stool antigen. History of NSAIDs. Has GERD- ? component of gastritis? bid PPI therapy. Avoid NSAIDs. Agree with pain management. Will also consider ERCP for Monday in order to assess for PUD/gastritis, as well as addressing jaundice- this will allow for better direct  evaluation of biliary ductal system.  3. Itch: likely related to elevated bilirubin- is on cholestyramine, will order hydroxyzine at his home dose for now..  Electronic Signatures: Vevelyn PatLondon, Brinlyn Cena H (NP)  (Signed 21-Jun-13 18:21)  Authored: Brief Consult Note   Last Updated: 21-Jun-13 18:21 by Keturah BarreLondon, Fantasha Daniele H (NP)

## 2014-10-08 NOTE — Consult Note (Signed)
Chief Complaint:   Subjective/Chief Complaint Pt in pain. Some chills today with rising WBC. Given rocephin for PTC yest. LFT still high.   VITAL SIGNS/ANCILLARY NOTES: **Vital Signs.:   28-Jun-13 10:17   Vital Signs Type Q 4hr   Temperature Temperature (F) 98.1   Celsius 36.7   Temperature Source Oral   Pulse Pulse 98   Pulse source per vital sign device   Respirations Respirations 20   Systolic BP Systolic BP 470   Diastolic BP (mmHg) Diastolic BP (mmHg) 88   Mean BP 111   Pulse Ox % Pulse Ox % 94   Pulse Ox Activity Level  At rest   Oxygen Delivery Room Air/ 21 %   Brief Assessment:   Cardiac Regular    Respiratory clear BS    Gastrointestinal tender in RUQ. Draining bile.   Lab Results: Hepatic:  28-Jun-13 04:56    Bilirubin, Total  22.2   Alkaline Phosphatase  275   SGPT (ALT)  95 (12-78 NOTE: NEW REFERENCE RANGE 05/09/2011)   SGOT (AST)  118   Total Protein, Serum 6.8   Albumin, Serum  2.2  Routine BB:  28-Jun-13 04:56    ABO Group + Rh Type B Positive   Antibody Screen NEGATIVE (Result(s) reported on 12 Dec 2011 at 09:10AM.)  Routine Chem:  28-Jun-13 04:56    Glucose, Serum  190   BUN 12   Creatinine (comp) 0.76   Sodium, Serum  135   Potassium, Serum 4.4   Chloride, Serum 102   CO2, Serum 27   Calcium (Total), Serum 8.7   Osmolality (calc) 275   eGFR (African American) >60   eGFR (Non-African American) >60 (eGFR values <68m/min/1.73 m2 may be an indication of chronic kidney disease (CKD). Calculated eGFR is useful in patients with stable renal function. The eGFR calculation will not be reliable in acutely ill patients when serum creatinine is changing rapidly. It is not useful in  patients on dialysis. The eGFR calculation may not be applicable to patients at the low and high extremes of body sizes, pregnant women, and vegetarians.)   Anion Gap  6  Routine Hem:  28-Jun-13 04:56    WBC (CBC)  20.7   RBC (CBC)  3.71   Hemoglobin (CBC)  11.6    Hematocrit (CBC)  34.7   Platelet Count (CBC) 198   MCV 93   MCH 31.3   MCHC 33.6   RDW  17.4   Neutrophil % 67.7   Lymphocyte % 23.1   Monocyte % 8.6   Eosinophil % 0.2   Basophil % 0.4   Neutrophil #  14.1   Lymphocyte #  4.8   Monocyte #  1.8   Eosinophil # 0.0   Basophil # 0.1 (Result(s) reported on 12 Dec 2011 at 07:56AM.)   Assessment/Plan:  Assessment/Plan:   Assessment Obstructive jaundice. External bile drainage in place. WBC elevated. Resumed rocephin.    Plan Bld cx if high temp. Consider changing Abx if rocephin inadequate. IR to follow. DR. Iftikhar to see patient over next several  days. Thanks.   Electronic Signatures: OVerdie Shire(MD)  (Signed 28-Jun-13 13:41)  Authored: Chief Complaint, VITAL SIGNS/ANCILLARY NOTES, Brief Assessment, Lab Results, Assessment/Plan   Last Updated: 28-Jun-13 13:41 by OVerdie Shire(MD)

## 2014-10-08 NOTE — Consult Note (Signed)
Chief Complaint:   Subjective/Chief Complaint patient continues with abdominal pain, relatively unchanged.  no n/v. last bm 2 days ago.   VITAL SIGNS/ANCILLARY NOTES: **Vital Signs.:   22-Jun-13 15:35   Vital Signs Type Routine   Temperature Temperature (F) 97.4   Celsius 36.3   Temperature Source oral   Pulse Pulse 89   Pulse source per vital sign device   Respirations Respirations 20   Systolic BP Systolic BP 117   Diastolic BP (mmHg) Diastolic BP (mmHg) 70   Mean BP 85   BP Source vital sign device   Pulse Ox % Pulse Ox % 97   Pulse Ox Activity Level  At rest   Oxygen Delivery Room Air/ 21 %   Brief Assessment:   Cardiac Regular    Respiratory clear BS    Gastrointestinal details normal Bowel sounds normal  No gaurding  obese, diffuse tenderness, mostly luq, firm hepatomegally, palpable left lobe of liver, firm.   Lab Results: Hepatic:  21-Jun-13 05:56    Bilirubin, Total  17.9   Bilirubin, Direct  14.10 (Result(s) reported on 05 Dec 2011 at 08:53PM.)   Alkaline Phosphatase  260   SGPT (ALT)  97 (12-78 NOTE: NEW REFERENCE RANGE 05/09/2011)   SGOT (AST)  87  22-Jun-13 05:55    Bilirubin, Total  19.0   Bilirubin, Direct  15.00 (Result(s) reported on 06 Dec 2011 at 07:25AM.)   Alkaline Phosphatase  266   SGPT (ALT)  92 (12-78 NOTE: NEW REFERENCE RANGE 05/09/2011)   SGOT (AST)  88  Routine Sero:  21-Jun-13 23:54    Occult Blood, Feces POSITIVE (Result(s) reported on 06 Dec 2011 at 12:57AM.)   Assessment/Plan:  Assessment/Plan:   Assessment 1) abdominal pain generalized, mostly luq in the setting of h/o metastatic pancreatic ca.  last ct showin no evidence of metastatic disease.  New left hepatic lobe mass per MRI, AFP and CEA pending.  heme positive, with recent nsaid use.  Concern for possible gastritis/pudz.  Currently on iv bid ppi.   2) hepatitis C antibody positive, awaiting PCR to verify status.  3) hyperbilirubinemia, mostly conjugated.  obstructive  picture, however no dilated ducts.  intrahepatic cholestasis?    Plan 1) EGD, possible ERCP monday.  continue daily lft, will recheck clotting parameters monday. continue current.   Electronic Signatures: Barnetta ChapelSkulskie, Martin (MD)  (Signed 22-Jun-13 17:30)  Authored: Chief Complaint, VITAL SIGNS/ANCILLARY NOTES, Brief Assessment, Lab Results, Assessment/Plan   Last Updated: 22-Jun-13 17:30 by Barnetta ChapelSkulskie, Martin (MD)

## 2014-10-08 NOTE — Consult Note (Signed)
PATIENT NAME:  William Everett, William Everett MR#:  [id number] DATE OF BIRTH:  12/30/1965  DATE OF CONSULTATION:  12/05/2011  REFERRING PHYSICIAN:   CONSULTING PHYSICIAN:  Keturah Barre, NP  HISTORY OF PRESENT ILLNESS: [name] is a 49 year old African American gentleman with history of pancreatic cancer with liver mets treated with FOLFIRINOX and FOLFIRI last year, hepatitis C, gastroesophageal reflux disease, peptic ulcer disease. He was admitted yesterday with intractable abdominal pain. Patient reports that he is in the middle of a work-up to determine whether or not he has a recurrence of his cancer or if he is having liver difficulties due to his hepatitis C. States he was originally diagnosed with his hep C when he was 25. Shortly thereafter received interferon therapy, was told by a physician that the treatment had worked and the virus had cleared. Recently he has undergone abdominal ultrasound, M.R.C.P. and liver biopsy in regards to increasing bilirubin and other liver enzymes. Ultrasound this month demonstrated no gallbladder, no ascites, no definite mass to the liver. CT of the abdomen and pelvis done also in June of this year demonstrated no liver mass but some fatty infiltration. Spleen was unremarkable. Questionable mass in the pancreatic tail. Underwent M.R.C.P. also 06/13 that demonstrated no gallbladder, no ductal dilation, questionable mass in the left liver lobe and also some tissue prominence of the pancreatic tail and the splenic hilar region. His bilirubin has been progressively increasing since May, most recent count was 17.9 with an ALT 260, AST 87, ALT 97, total protein 7.1, serum albumin 2.6. The patient is jaundiced. Liver biopsy results are pending. He does have positive hepatitis C surface antibody. I do not see a viral load anywhere. He has had negative hepatitis B surface antigen in the last six weeks. The patient states that he has developed increasing right upper quadrant abdominal pain.  Having his liver biopsy did not change this. He does state that food increases it but the pain does not radiate. He describes the pain as burning. States it prevents him from lying on his back or his left side. He has occasional nausea. Does report over the last 10 days he has taken several rounds of Advil to try to help with the pain. States now that the whole abdomen hurts but the prime spot is the right upper quadrant. Also reports some bloating, but states this is better. Does report one loose stool, states this is now back to normal. Denies black, tarry stools, bloody stools, indigestion, loss of appetite. Only other complaint right at present is the complaint of itch. He says it is awful and the patient does scratch several times as I am interviewing him.   PAST MEDICAL HISTORY:  1. Hepatitis C treated with interferon several years ago. 2. Gastric ulcers.  3. Hiatal hernia. 4. 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.  5. Heel and wrist surgeries.  6. Sleep apnea.  7. Pancreatic cancer diagnosed by liver biopsy in March 2012. Patient treated with FOLFIRINOX and FOLFIRI.    FAMILY HISTORY: No colorectal cancer, peptic ulcers. Questionable alcoholic liver disease in family.   SOCIAL HISTORY: Former history of alcohol use, none now, stopped in 2011. Quit tobacco IN 2005. No illicits. Lives with family.   ALLERGIES: No known drug allergies.   MEDICATIONS:  1. MS Contin 90 mg p.o. q.8 hours.  2. Cholestyramine 4 grams p.o. b.i.d.  3. Hydroxyzine 25 mg p.o. q.i.d. p.r.n.  4. Roxanol 10 to 20  mg q.2 p.r.n.  5. Prilosec 20 mg p.o. daily.  6. MiraLax one packet p.o. daily.  7. Phenergan 25 mg p.o. q.6 hours p.r.n.  8. Zofran 4 mg p.o. q.4 hours p.r.n.  9. Colace 100 mg p.o. b.i.d.   LABORATORY, DIAGNOSTIC AND RADIOLOGICAL DATA: Most recent lab work: Glucose 319, BUN 9, creatinine 0.73 sodium 142, potassium 4.1, chloride 26, GFR greater than 60, calcium 9.1, total  protein 7.1, albumin 2.6, total bilirubin 17.9, alkaline phosphatase 260, AST 87, ALT 97, WBC 15.3, hemoglobin 13.3, platelets 220, normocytic red cells with increased RDW. PT 13.4, INR 1.0. Liver biopsy pending. CA-19-9 has been elevated, was 101 on 05/07. Imaging studies, as noted.   PHYSICAL EXAMINATION:  VITAL SIGNS: Most recent vital signs: Temperature 98.1, pulse 90, respiratory rate 18, blood pressure 128/81, oxygen saturation 95%.   GENERAL: Middle-aged [place] male in bed in no acute distress at present.   HEENT: Normocephalic, atraumatic. Sclerae are jaundiced. No redness, drainage, or inflammation to the eyes or nares. Oral mucous membranes are pink and moist.   NECK: No thyromegaly, lymphadenopathy, JVD.   RESPIRATORY: Respirations eupneic. Lungs CTAB.   CARDIAC: S1, S2. Regular rate and rhythm. No MRG. Peripheral pulses 2+. No appreciable edema.   ABDOMEN: Obese abdomen, rounded. Active bowel sounds x4. Generalized tenderness more pronounced to the right upper quadrant. Difficult to appreciate hepatosplenomegaly or masses. No hernias, rebound tenderness or peritoneal signs noted. The abdomen is soft.   RECTAL: Deferred.   GENITOURINARY: Deferred.   EXTREMITIES: No appreciable edema. Moves all extremities well x4.   SKIN: Warm, dry, pink. No erythema, lesion or rash.   NEUROLOGIC: Alert and oriented x3. Cranial nerves II through XII grossly intact. Speech clear. No facial droop.   PSYCHIATRIC: Pleasant. Feels tired of having pain. Mood stable. Logical thought.   IMPRESSION AND PLAN:  1. History of pancreatic cancer, liver mets and hepatitis C awaiting liver biopsy results. No hep C viral load results, will order to see if virus active. CA-19-9 is elevated. Will add AFP and CEA due to hepatic mass.  2. Exacerbation of right upper quadrant pain in association with jaundice. Lipase, direct bilirubin, H. pylori stool antigen. Stop NSAIDs. Has had history of  gastroesophageal reflux disease and peptic ulcer disease. Is there a component of gastritis to his complaint. Will order PPI b.i.d. Agree with pain management. Will also consider ERCP for Monday in order to assess for peptic ulcer disease, gastritis as well as addressing jaundice. This will allow for better direct evaluation of the biliary ductal system. 3. Itch likely related to elevated bilirubin. Is on cholestyramine. Will order hydroxyzine at his home dose for now.   These services were provided by Vevelyn Pathristiane London, MSN, [facility] in collaboration with Barnetta ChapelMartin Skulskie, M.D. with whom I have discussed this patient in full.  ____________________________ Keturah Barrehristiane H. London, NP chl:cms D: 12/05/2011 18:20:17 ET T: 12/06/2011 07:48:18 ET JOB#: [id number]  cc: Keturah Barrehristiane H. London, NP, <Dictator> Eustaquio MaizeHRISTIANE H LONDON FNP ELECTRONICALLY SIGNED 12/08/2011 11:43

## 2014-10-08 NOTE — Consult Note (Signed)
ERCP performed. No bile drainage. Only one intrahepatic system filled with contrast, suggesting the other system is completely obstructed. CBD minimally opacified with contrast. Placed a 7 Fr x 7 cm stent in. Not sure if this stent is long enough to relieve obstruction. Do not have any longer stent. Will order longer stent overnight. If LFT does not improve, then will consider placing longer stent by Wed or Thurs. Thanks.  Electronic Signatures: Lutricia Feilh, Sala Tague (MD)  (Signed on 24-Jun-13 15:11)  Authored  Last Updated: 24-Jun-13 15:11 by Lutricia Feilh, Janayah Zavada (MD)

## 2014-10-08 NOTE — Consult Note (Signed)
ERCP repeated. 7cm biliary stent removed. 12 cm biliary stent placed as far as I can push. Today, left intrahepatic system did not fill. Will see if longer stent will improve jaundice soon. If not, contact IR to place percutaneous hepatic drainage. Thanks.  Electronic Signatures: Lutricia Feilh, Sarahjane Matherly (MD)  (Signed on 26-Jun-13 15:58)  Authored  Last Updated: 26-Jun-13 15:58 by Lutricia Feilh, Rhythm Wigfall (MD)
